# Patient Record
Sex: Male | Born: 1955 | Race: White | Hispanic: No | Marital: Single | Smoking: Current every day smoker
Health system: Southern US, Community
[De-identification: ages and names within clinical notes are randomized; demographics above are authoritative.]

## PROBLEM LIST (undated history)

## (undated) DIAGNOSIS — F329 Major depressive disorder, single episode, unspecified: Secondary | ICD-10-CM

## (undated) DIAGNOSIS — F419 Anxiety disorder, unspecified: Secondary | ICD-10-CM

## (undated) DIAGNOSIS — R3915 Urgency of urination: Secondary | ICD-10-CM

## (undated) DIAGNOSIS — J439 Emphysema, unspecified: Secondary | ICD-10-CM

## (undated) DIAGNOSIS — Z86010 Personal history of colonic polyps: Secondary | ICD-10-CM

## (undated) DIAGNOSIS — M549 Dorsalgia, unspecified: Secondary | ICD-10-CM

## (undated) DIAGNOSIS — E785 Hyperlipidemia, unspecified: Secondary | ICD-10-CM

## (undated) DIAGNOSIS — J449 Chronic obstructive pulmonary disease, unspecified: Secondary | ICD-10-CM

## (undated) DIAGNOSIS — Z8709 Personal history of other diseases of the respiratory system: Secondary | ICD-10-CM

## (undated) DIAGNOSIS — Z8489 Family history of other specified conditions: Secondary | ICD-10-CM

## (undated) DIAGNOSIS — K219 Gastro-esophageal reflux disease without esophagitis: Secondary | ICD-10-CM

## (undated) DIAGNOSIS — H269 Unspecified cataract: Secondary | ICD-10-CM

## (undated) DIAGNOSIS — F32A Depression, unspecified: Secondary | ICD-10-CM

## (undated) DIAGNOSIS — K5790 Diverticulosis of intestine, part unspecified, without perforation or abscess without bleeding: Secondary | ICD-10-CM

## (undated) DIAGNOSIS — R200 Anesthesia of skin: Secondary | ICD-10-CM

## (undated) DIAGNOSIS — G4700 Insomnia, unspecified: Secondary | ICD-10-CM

## (undated) DIAGNOSIS — C4491 Basal cell carcinoma of skin, unspecified: Secondary | ICD-10-CM

## (undated) DIAGNOSIS — K4090 Unilateral inguinal hernia, without obstruction or gangrene, not specified as recurrent: Secondary | ICD-10-CM

## (undated) DIAGNOSIS — G8929 Other chronic pain: Secondary | ICD-10-CM

## (undated) HISTORY — DX: Basal cell carcinoma of skin, unspecified: C44.91

## (undated) HISTORY — PX: OTHER SURGICAL HISTORY: SHX169

## (undated) HISTORY — DX: Gastro-esophageal reflux disease without esophagitis: K21.9

## (undated) HISTORY — DX: Chronic obstructive pulmonary disease, unspecified: J44.9

## (undated) HISTORY — DX: Dorsalgia, unspecified: M54.9

## (undated) HISTORY — PX: FINGER SURGERY: SHX640

## (undated) HISTORY — PX: COLONOSCOPY: SHX174

## (undated) HISTORY — PX: EYE SURGERY: SHX253

## (undated) HISTORY — DX: Other chronic pain: G89.29

## (undated) HISTORY — DX: Hyperlipidemia, unspecified: E78.5

---

## 1997-11-12 ENCOUNTER — Ambulatory Visit (HOSPITAL_COMMUNITY): Admission: RE | Admit: 1997-11-12 | Discharge: 1997-11-12 | Payer: Self-pay | Admitting: Neurosurgery

## 1997-11-12 ENCOUNTER — Encounter: Payer: Self-pay | Admitting: Neurosurgery

## 1998-07-15 ENCOUNTER — Encounter: Payer: Self-pay | Admitting: Neurosurgery

## 1998-07-15 ENCOUNTER — Ambulatory Visit (HOSPITAL_COMMUNITY): Admission: RE | Admit: 1998-07-15 | Discharge: 1998-07-15 | Payer: Self-pay | Admitting: Neurosurgery

## 1998-10-30 ENCOUNTER — Emergency Department (HOSPITAL_COMMUNITY): Admission: EM | Admit: 1998-10-30 | Discharge: 1998-10-30 | Payer: Self-pay | Admitting: Emergency Medicine

## 1998-10-30 ENCOUNTER — Encounter: Payer: Self-pay | Admitting: Emergency Medicine

## 1999-01-14 ENCOUNTER — Encounter: Payer: Self-pay | Admitting: Neurosurgery

## 1999-01-14 ENCOUNTER — Ambulatory Visit (HOSPITAL_COMMUNITY): Admission: RE | Admit: 1999-01-14 | Discharge: 1999-01-14 | Payer: Self-pay | Admitting: Neurosurgery

## 2001-11-02 ENCOUNTER — Encounter: Payer: Self-pay | Admitting: Neurosurgery

## 2001-11-02 ENCOUNTER — Ambulatory Visit (HOSPITAL_COMMUNITY): Admission: RE | Admit: 2001-11-02 | Discharge: 2001-11-02 | Payer: Self-pay | Admitting: Neurosurgery

## 2002-11-23 ENCOUNTER — Encounter: Admission: RE | Admit: 2002-11-23 | Discharge: 2002-11-23 | Payer: Self-pay | Admitting: Neurosurgery

## 2002-12-13 ENCOUNTER — Encounter: Payer: Self-pay | Admitting: Neurosurgery

## 2002-12-13 ENCOUNTER — Encounter: Admission: RE | Admit: 2002-12-13 | Discharge: 2002-12-13 | Payer: Self-pay | Admitting: Neurosurgery

## 2004-06-16 ENCOUNTER — Ambulatory Visit (HOSPITAL_COMMUNITY): Admission: RE | Admit: 2004-06-16 | Discharge: 2004-06-16 | Payer: Self-pay | Admitting: Neurosurgery

## 2007-08-04 ENCOUNTER — Encounter: Admission: RE | Admit: 2007-08-04 | Discharge: 2007-08-04 | Payer: Self-pay | Admitting: Internal Medicine

## 2010-03-15 ENCOUNTER — Encounter: Payer: Self-pay | Admitting: Neurosurgery

## 2010-04-13 ENCOUNTER — Ambulatory Visit (INDEPENDENT_AMBULATORY_CARE_PROVIDER_SITE_OTHER): Payer: PRIVATE HEALTH INSURANCE | Admitting: Emergency Medicine

## 2010-04-13 ENCOUNTER — Other Ambulatory Visit: Payer: Self-pay | Admitting: Emergency Medicine

## 2010-04-13 ENCOUNTER — Ambulatory Visit
Admission: RE | Admit: 2010-04-13 | Discharge: 2010-04-13 | Disposition: A | Discharge status: Home / Self Care | Payer: 59 | Source: Ambulatory Visit | Attending: Emergency Medicine | Admitting: Emergency Medicine

## 2010-04-13 ENCOUNTER — Encounter: Payer: Self-pay | Admitting: Emergency Medicine

## 2010-04-13 DIAGNOSIS — R52 Pain, unspecified: Secondary | ICD-10-CM

## 2010-04-13 DIAGNOSIS — M79609 Pain in unspecified limb: Secondary | ICD-10-CM

## 2010-04-13 DIAGNOSIS — E785 Hyperlipidemia, unspecified: Secondary | ICD-10-CM | POA: Insufficient documentation

## 2010-04-21 NOTE — Assessment & Plan Note (Signed)
Summary: RIGHT RING FINGER DISLOCATED Procedure Room   Vital Signs:  Patient Profile:   55 Years Old Male CC:      Rt ring finger pain x 2 weeks injured while playing with his dog Height:     74 inches Weight:      172 pounds O2 Sat:      96 % O2 treatment:    Room Air Temp:     97.6 degrees F oral Pulse rate:   14 / minute Pulse rhythm:   regular Resp:     14 per minute BP sitting:   142 / 84  (left arm) Cuff size:   regular  Vitals Entered By: Emilio Math (April 13, 2010 1:05 PM)                History of Present Illness History from: patient Chief Complaint: Rt ring finger pain x 2 weeks injured while playing with his dog History of Present Illness: Playing with his dog 2 weeks ago and injured his R 4th finger.  It initially hurt a lot but the pain has subsided.  Now it feels loose to him.  No swelling or bruising.  It is affected his ADL's at home.  No other injuries noted.  No previous injuries.  REVIEW OF SYSTEMS Constitutional Symptoms      Denies fever, chills, night sweats, weight loss, weight gain, and fatigue.  Eyes       Denies change in vision, eye pain, eye discharge, glasses, contact lenses, and eye surgery. Ear/Nose/Throat/Mouth       Denies hearing loss/aids, change in hearing, ear pain, ear discharge, dizziness, frequent runny nose, frequent nose bleeds, sinus problems, sore throat, hoarseness, and tooth pain or bleeding.  Respiratory       Denies dry cough, productive cough, wheezing, shortness of breath, asthma, bronchitis, and emphysema/COPD.  Cardiovascular       Denies murmurs, chest pain, and tires easily with exhertion.    Gastrointestinal       Denies stomach pain, nausea/vomiting, diarrhea, constipation, blood in bowel movements, and indigestion. Genitourniary       Denies painful urination, kidney stones, and loss of urinary control. Neurological       Denies paralysis, seizures, and fainting/blackouts. Musculoskeletal       Complains  of muscle pain and joint pain.      Denies joint stiffness, decreased range of motion, redness, swelling, muscle weakness, and gout.      Comments: Pain with movement Skin       Denies bruising, unusual mles/lumps or sores, and hair/skin or nail changes.  Psych       Denies mood changes, temper/anger issues, anxiety/stress, speech problems, depression, and sleep problems.  Past History:  Past Medical History: Hyperlipidemia  Past Surgical History: Denies surgical history  Family History: Mother, CABG Father, D, Diabetes  Social History: 1 ppw, 40 years ETOH-yes No DRugs Unemployed Physical Exam General appearance: well developed, well nourished, no acute distress MSE: oriented to time, place, and person R 4th finger: normal DIP and PIP function.  +TTP at MCP and a large amount of ulnar deviation is possible at the MCP (compared to opposite side).  Cannot feel any specific deformity.  Fingers point to scaphoid when making fist.  Knuckle slightly smaller than opposite side. Assessment New Problems: FINGER PAIN (ICD-729.5) HYPERLIPIDEMIA (ICD-272.4)   Plan New Orders: New Patient Level III [99203] T-DG Hand Complete*R* [73130] Planning Comments:   Xray is read by radiology as normal.  I believe this is a ligament damage and I would like him to follow up with Dr. Melvyn Novas for this in case he needs surgical intervention for ligamentous injury.  Will defer to ortho for definitive treatment/splinting/etc.  Tylenol/Ibu as needed for pain.    The patient and/or caregiver has been counseled thoroughly with regard to medications prescribed including dosage, schedule, interactions, rationale for use, and possible side effects and they verbalize understanding.  Diagnoses and expected course of recovery discussed and will return if not improved as expected or if the condition worsens. Patient and/or caregiver verbalized understanding.   Orders Added: 1)  New Patient Level III  [04540] 2)  T-DG Hand Complete*R* [73130]

## 2010-04-24 ENCOUNTER — Other Ambulatory Visit: Payer: Self-pay | Admitting: Orthopedic Surgery

## 2010-04-24 ENCOUNTER — Ambulatory Visit
Admission: RE | Admit: 2010-04-24 | Discharge: 2010-04-24 | Disposition: A | Discharge status: Home / Self Care | Payer: Medicare Other | Source: Ambulatory Visit | Attending: Orthopedic Surgery | Admitting: Orthopedic Surgery

## 2010-04-24 DIAGNOSIS — M79644 Pain in right finger(s): Secondary | ICD-10-CM

## 2010-05-25 ENCOUNTER — Ambulatory Visit: Payer: PRIVATE HEALTH INSURANCE | Attending: Orthopedic Surgery | Admitting: Physical Therapy

## 2010-05-25 DIAGNOSIS — M2560 Stiffness of unspecified joint, not elsewhere classified: Secondary | ICD-10-CM | POA: Insufficient documentation

## 2010-05-25 DIAGNOSIS — M6281 Muscle weakness (generalized): Secondary | ICD-10-CM | POA: Insufficient documentation

## 2010-05-25 DIAGNOSIS — IMO0001 Reserved for inherently not codable concepts without codable children: Secondary | ICD-10-CM | POA: Insufficient documentation

## 2010-06-05 ENCOUNTER — Encounter: Payer: 59 | Admitting: Physical Therapy

## 2010-06-08 ENCOUNTER — Ambulatory Visit: Payer: PRIVATE HEALTH INSURANCE | Admitting: Physical Therapy

## 2010-06-15 ENCOUNTER — Ambulatory Visit: Payer: PRIVATE HEALTH INSURANCE | Admitting: Physical Therapy

## 2010-06-15 ENCOUNTER — Encounter: Payer: 59 | Admitting: Physical Therapy

## 2010-06-16 ENCOUNTER — Encounter: Payer: PRIVATE HEALTH INSURANCE | Admitting: Physical Therapy

## 2010-06-22 ENCOUNTER — Encounter: Payer: PRIVATE HEALTH INSURANCE | Admitting: Physical Therapy

## 2010-06-25 ENCOUNTER — Encounter: Payer: PRIVATE HEALTH INSURANCE | Admitting: Physical Therapy

## 2010-08-25 ENCOUNTER — Ambulatory Visit
Admission: RE | Admit: 2010-08-25 | Discharge: 2010-08-25 | Disposition: A | Discharge status: Home / Self Care | Payer: PRIVATE HEALTH INSURANCE | Source: Ambulatory Visit | Attending: Internal Medicine | Admitting: Internal Medicine

## 2010-08-25 ENCOUNTER — Other Ambulatory Visit: Payer: Self-pay | Admitting: Internal Medicine

## 2010-08-25 DIAGNOSIS — R05 Cough: Secondary | ICD-10-CM

## 2010-08-25 DIAGNOSIS — R059 Cough, unspecified: Secondary | ICD-10-CM

## 2010-09-11 ENCOUNTER — Other Ambulatory Visit: Payer: Self-pay | Admitting: Orthopedic Surgery

## 2010-09-11 DIAGNOSIS — S63639A Sprain of interphalangeal joint of unspecified finger, initial encounter: Secondary | ICD-10-CM

## 2010-09-11 DIAGNOSIS — S6390XA Sprain of unspecified part of unspecified wrist and hand, initial encounter: Secondary | ICD-10-CM

## 2010-09-17 ENCOUNTER — Other Ambulatory Visit: Payer: PRIVATE HEALTH INSURANCE

## 2010-09-18 ENCOUNTER — Ambulatory Visit
Admission: RE | Admit: 2010-09-18 | Discharge: 2010-09-18 | Disposition: A | Discharge status: Home / Self Care | Payer: PRIVATE HEALTH INSURANCE | Source: Ambulatory Visit | Attending: Orthopedic Surgery | Admitting: Orthopedic Surgery

## 2010-09-18 DIAGNOSIS — S63639A Sprain of interphalangeal joint of unspecified finger, initial encounter: Secondary | ICD-10-CM

## 2010-09-18 DIAGNOSIS — S6390XA Sprain of unspecified part of unspecified wrist and hand, initial encounter: Secondary | ICD-10-CM

## 2010-09-18 MED ORDER — IOHEXOL 180 MG/ML  SOLN
1.0000 mL | Freq: Once | INTRAMUSCULAR | Status: AC | PRN
  Administered 2010-09-18: 1 mL via INTRA_ARTICULAR

## 2010-12-23 ENCOUNTER — Encounter: Payer: Self-pay | Admitting: Pulmonary Disease

## 2010-12-24 ENCOUNTER — Institutional Professional Consult (permissible substitution): Payer: PRIVATE HEALTH INSURANCE | Admitting: Pulmonary Disease

## 2011-01-22 ENCOUNTER — Ambulatory Visit (INDEPENDENT_AMBULATORY_CARE_PROVIDER_SITE_OTHER)
Admission: RE | Admit: 2011-01-22 | Discharge: 2011-01-22 | Disposition: A | Discharge status: Home / Self Care | Payer: Medicare Other | Source: Ambulatory Visit | Attending: Pulmonary Disease | Admitting: Pulmonary Disease

## 2011-01-22 ENCOUNTER — Ambulatory Visit (INDEPENDENT_AMBULATORY_CARE_PROVIDER_SITE_OTHER): Payer: Medicare Other | Admitting: Pulmonary Disease

## 2011-01-22 ENCOUNTER — Encounter: Payer: Self-pay | Admitting: Pulmonary Disease

## 2011-01-22 VITALS — BP 112/78 | HR 90 | Temp 98.1°F | Ht 74.0 in | Wt 180.8 lb

## 2011-01-22 DIAGNOSIS — J449 Chronic obstructive pulmonary disease, unspecified: Secondary | ICD-10-CM

## 2011-01-22 NOTE — Assessment & Plan Note (Signed)
He has extensive prior history of smoking.  He has since quit.  He has very mild obstruction on spirometry today.  He is not sure if scheduled inhaler therapy is helping him.  I will repeat his chest xray today and get copy of his records from [facility].  Advised him he can try stopping spiriva and monitor his symptoms.  He is to call if his breathing gets worse.  He can continue proair as needed.  He had his pneumonia shot 2 years ago.  He did get his flu shot this season.

## 2011-01-22 NOTE — Progress Notes (Signed)
Chief Complaint  Patient presents with  . Advice Only    Pt was dx w/ COPD 08/25/10. Pt here for second opinion.     History of Present Illness:  Austin Frazier is a 55 y.o. male former smoker for evaluation of COPD.  He has an extensive prior history of smoking.  He went on a trip with his partner to [place].  He developed a bronchitis then.  As a result he went to see his PCP.  He had a chest xray in July which showed hyperinflation suggestive of COPD.  He was then sent to pulmonary at [facility].  He had spirometry there, and told he has COPD.  He was started on spiriva.  He was referred to [facility] for a second opinion.  He quit smoking in July.  He used to get wheezing, but not recently.  He had noticed getting winded with activity for the past two years.  Since he stopped smoking his breathing has improved.  He is now able to exercise on a daily basis for about 40 minutes, which includes cardiovascular and weight training.  He still gets cough with clear to yellow sputum, but not as much as before.  He denies hemoptysis.  There is no history of asthma, pneumonia, or tuberculosis.  He does have a history of allergies, and was told he is allergic to ragweed.  He has trouble with his sinuses and epistaxis, and has been seen by Dr. Annalee Genta with ENT.  He was told he may need sinus surgery.  He was using afrin.  He has since been switched to nasal irrigation and flonase.  With this switch he no longer has nose bleeds, but still has sinus congestion.  He thinks his nose bleeds started when he started spiriva.  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  He denies other occupational exposures.  He has a Development worker, international aid, but no other animal exposures.  He is from [place].  He currently lives with his partner.  Past Medical History  Diagnosis Date  . Back pain, chronic   . GERD (gastroesophageal reflux disease)   . Hyperlipemia   . COPD, mild   . Basal cell carcinoma   .  Chronic sinusitis     No past surgical history on file.  Current Outpatient Prescriptions on File Prior to Visit  Medication Sig Dispense Refill  . albuterol (PROVENTIL HFA;VENTOLIN HFA) 108 (90 BASE) MCG/ACT inhaler Inhale 2 puffs into the lungs every 6 (six) hours as needed.        . ALPRAZolam (XANAX) 0.5 MG tablet Take 0.5 mg by mouth 3 (three) times daily as needed.        . butalbital-acetaminophen-caffeine (FIORICET WITH CODEINE) 50-325-40-30 MG per capsule Take 1 capsule by mouth every 4 (four) hours as needed.        . fluticasone (FLONASE) 50 MCG/ACT nasal spray Place 2 sprays into the nose daily.        Marland Kitchen HYDROcodone-acetaminophen (NORCO) 10-325 MG per tablet Take 1 tablet by mouth every 6 (six) hours as needed.          No Known Allergies  family history includes Diabetes in his brother and father; Heart attack in his mother; Hyperlipidemia in his mother; Hypertension in his mother; and Stroke in his mother.   reports that he quit smoking about 4 months ago. He does not have any smokeless tobacco history on file. He reports that he drinks alcohol. He  reports that he does not use illicit drugs.  Review of Systems - 12 point negative except above  Blood pressure 112/78, pulse 90, temperature 98.1 F (36.7 C), temperature source Oral, height 6\' 2"  (1.88 m), weight 180 lb 12.8 oz (82.01 kg), SpO2 98.00%. Body mass index is 23.21 kg/(m^2).  Physical Exam:  General - Thin, healthy HEENT - Wears glasses, PERRLA, EOMI, no sinus tenderness, clear nasal discharge, no oral exudate, no LAN, no thyromegaly Cardiac - s1s2 regular, no murmur Chest - good air entry, no wheeze/rales/dullness Abdomen - soft, non-tender, no organomegaly Extremities - no e/c/c Neurologic - normal strength, CN intact Skin - no rashes Psychiatric - normal mood, behavior  *RADIOLOGY REPORT* 08/25/10: Clinical Data: Cough and congestion, smoker, lethargy  CHEST - 2 VIEW  Comparison: None.  Findings: The  cardiac silhouette, mediastinum, pulmonary vasculature are within normal limits. Both lungs are clear. There is hyperexpansion, consistent with an element of COPD. There is no acute bony abnormality.  IMPRESSION:  Hyperexpansion, consistent with COPD.  There is no evidence of acute cardiac or pulmonary process.  Original Report Authenticated By: Brandon Melnick, M.D.  Spirometry 01/22/11>>FEV1 3.72(88%), FEV1% 69.  Borderline obstruction.  Assessment/Plan:  COPD (chronic obstructive pulmonary disease) He has extensive prior history of smoking.  He has since quit.  He has very mild obstruction on spirometry today.  He is not sure if scheduled inhaler therapy is helping him.  I will repeat his chest xray today and get copy of his records from [facility].  Advised him he can try stopping spiriva and monitor his symptoms.  He is to call if his breathing gets worse.  He can continue proair as needed.  He had his pneumonia shot 2 years ago.  He did get his flu shot this season.     Outpatient Encounter Prescriptions as of 01/22/2011  Medication Sig Dispense Refill  . albuterol (PROVENTIL HFA;VENTOLIN HFA) 108 (90 BASE) MCG/ACT inhaler Inhale 2 puffs into the lungs every 6 (six) hours as needed.        . ALPRAZolam (XANAX) 0.5 MG tablet Take 0.5 mg by mouth 3 (three) times daily as needed.        Marland Kitchen ascorbic acid (VITAMIN C) 1000 MG tablet Take 1,000 mg by mouth daily.        Marland Kitchen aspirin 81 MG tablet Take 81 mg by mouth daily.        . butalbital-acetaminophen-caffeine (FIORICET WITH CODEINE) 50-325-40-30 MG per capsule Take 1 capsule by mouth every 4 (four) hours as needed.        . Eszopiclone (ESZOPICLONE) 3 MG TABS Take 3 mg by mouth at bedtime. Take immediately before bedtime       . fluticasone (FLONASE) 50 MCG/ACT nasal spray Place 2 sprays into the nose daily.        Marland Kitchen HYDROcodone-acetaminophen (NORCO) 10-325 MG per tablet Take 1 tablet by mouth every 6 (six) hours as needed.        Marland Kitchen  DISCONTD: ezetimibe-simvastatin (VYTORIN) 10-20 MG per tablet Take 1 tablet by mouth at bedtime.        Marland Kitchen DISCONTD: tiotropium (SPIRIVA) 18 MCG inhalation capsule Place 18 mcg into inhaler and inhale daily.        Marland Kitchen DISCONTD: esomeprazole (NEXIUM) 40 MG capsule Take 40 mg by mouth daily before breakfast.        . DISCONTD: Eszopiclone (ESZOPICLONE) 3 MG TABS Take 3 mg by mouth at bedtime. Take immediately before bedtime       .  DISCONTD: finasteride (PROPECIA) 1 MG tablet Take 1 mg by mouth daily.        Marland Kitchen DISCONTD: Fluticasone-Salmeterol (ADVAIR) 250-50 MCG/DOSE AEPB Inhale 1 puff into the lungs every 12 (twelve) hours.        Marland Kitchen DISCONTD: Liniments (BIOFREEZE/ILEX EX) Apply topically. Use as directed       . DISCONTD: meloxicam (MOBIC) 15 MG tablet Take 15 mg by mouth daily.          Thaily Hackworth Pager:  [phone] 01/22/2011, 12:57 PM

## 2011-01-22 NOTE — Patient Instructions (Signed)
Stop spiriva>>call if symptoms get worse Proair two puffs four times per day as needed for cough, wheeze, chest congestion, or shortness of breath Chest xray today>>will call with results Follow up in 6 months

## 2011-01-26 ENCOUNTER — Telehealth: Payer: Self-pay | Admitting: Pulmonary Disease

## 2011-01-26 MED ORDER — TIOTROPIUM BROMIDE MONOHYDRATE 18 MCG IN CAPS: 18.0000 ug | ORAL_CAPSULE | Freq: Every day | RESPIRATORY_TRACT | Status: DC

## 2011-01-26 NOTE — Telephone Encounter (Signed)
Dg Chest 2 View  01/22/2011  *RADIOLOGY REPORT*  Clinical Data: Former smoker, COPD, cough  CHEST - 2 VIEW  Comparison: 08/25/2010  Findings: Normal heart size, mediastinal contours, and pulmonary vascularity. Atherosclerotic calcification aortic arch. Emphysematous changes without infiltrate or effusion. No pneumothorax. Bones unremarkable.  IMPRESSION: Emphysematous changes consistent with COPD. No acute abnormalities.  Original Report Authenticated By: Lollie Marrow, M.D.   Discussed results with pt.  He noticed his breathing getting worse w/o spiriva.  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  Advised that he will need to continue with Spiriva.

## 2011-02-19 ENCOUNTER — Telehealth: Payer: Self-pay | Admitting: Pulmonary Disease

## 2011-02-19 NOTE — Telephone Encounter (Signed)
Received copies from Dr. Lorre Nick 02/19/11. Forwarded 6pages to Dr. Rosezella Rumpf review.

## 2011-07-02 ENCOUNTER — Ambulatory Visit (INDEPENDENT_AMBULATORY_CARE_PROVIDER_SITE_OTHER): Payer: Medicare Other | Admitting: Pulmonary Disease

## 2011-07-02 ENCOUNTER — Encounter: Payer: Self-pay | Admitting: Pulmonary Disease

## 2011-07-02 VITALS — BP 124/80 | HR 84 | Temp 98.0°F | Ht 74.0 in | Wt 180.4 lb

## 2011-07-02 DIAGNOSIS — J449 Chronic obstructive pulmonary disease, unspecified: Secondary | ICD-10-CM

## 2011-07-02 NOTE — Progress Notes (Signed)
Chief Complaint  Patient presents with  . Follow-up    Pt states his breathing has been fine. Still has occasional coughing w. phlem. denies any wheezing, chest tx. Pt states when he uses his proair does not feel like it helps--? alternative. Pt is requesting to have a breathing test done    History of Present Illness: Austin Frazier is a 56 y.o. male former smoker with COPD.  His breathing has been doing well.  He does not use his albuterol much.  He is not having much cough.    Past Medical History  Diagnosis Date  . Back pain, chronic   . GERD (gastroesophageal reflux disease)   . Hyperlipemia   . COPD, mild   . Basal cell carcinoma   . Chronic sinusitis     No past surgical history on file.  No Known Allergies  Physical Exam:  Blood pressure 124/80, pulse 84, temperature 98 F (36.7 C), temperature source Oral, height 6\' 2"  (1.88 m), weight 180 lb 6.4 oz (81.829 kg), SpO2 96.00%. Body mass index is 23.16 kg/(m^2). Wt Readings from Last 2 Encounters:  07/02/11 180 lb 6.4 oz (81.829 kg)  01/22/11 180 lb 12.8 oz (82.01 kg)    General - Thin, healthy  HEENT - Wears glasses, PERRLA, EOMI, no sinus tenderness, clear nasal discharge, no oral exudate, no LAN, no thyromegaly  Cardiac - s1s2 regular, no murmur  Chest - good air entry, no wheeze/rales/dullness  Abdomen - soft, non-tender, no organomegaly  Extremities - no e/c/c  Neurologic - normal strength, CN intact  Skin - no rashes  Psychiatric - normal mood, behavior   Assessment/Plan:  Outpatient Encounter Prescriptions as of 07/02/2011  Medication Sig Dispense Refill  . albuterol (PROVENTIL HFA;VENTOLIN HFA) 108 (90 BASE) MCG/ACT inhaler Inhale 2 puffs into the lungs every 6 (six) hours as needed.        . ALPRAZolam (XANAX) 0.5 MG tablet Take 0.5 mg by mouth 3 (three) times daily as needed.        Marland Kitchen ascorbic acid (VITAMIN C) 1000 MG tablet Take 1,000 mg by mouth daily.        Marland Kitchen aspirin 81 MG tablet Take 81 mg by  mouth daily.        . butalbital-acetaminophen-caffeine (FIORICET WITH CODEINE) 50-325-40-30 MG per capsule Take 1 capsule by mouth every 4 (four) hours as needed.        . Eszopiclone (ESZOPICLONE) 3 MG TABS Take 3 mg by mouth at bedtime. Take immediately before bedtime       . fexofenadine-pseudoephedrine (ALLEGRA-D) 60-120 MG per tablet Take 1 tablet by mouth daily.      . fluticasone (FLONASE) 50 MCG/ACT nasal spray Place 2 sprays into the nose daily.        Marland Kitchen HYDROcodone-acetaminophen (NORCO) 10-325 MG per tablet Take 1 tablet by mouth every 6 (six) hours as needed.        . tiotropium (SPIRIVA HANDIHALER) 18 MCG inhalation capsule Place 1 capsule (18 mcg total) into inhaler and inhale daily.  30 capsule  2    Austin Frazier Pager:  [phone] 07/02/2011, 1:52 PM

## 2011-07-02 NOTE — Patient Instructions (Signed)
Will schedule PFT and call with results Follow up in one year

## 2011-07-02 NOTE — Assessment & Plan Note (Signed)
Stable.  Will repeat his PFT and call him with results.  If numbers look okay, could try changing spiriva to qod.

## 2011-08-04 ENCOUNTER — Telehealth: Payer: Self-pay | Admitting: Pulmonary Disease

## 2011-08-04 MED ORDER — TIOTROPIUM BROMIDE MONOHYDRATE 18 MCG IN CAPS: 18.0000 ug | ORAL_CAPSULE | Freq: Every day | RESPIRATORY_TRACT | Status: DC

## 2011-08-04 NOTE — Telephone Encounter (Signed)
Refill resent pt is aware. Verified that pharmacy did receive the rx. Carron Curie, CMA

## 2011-08-04 NOTE — Telephone Encounter (Signed)
Refill sent. Pt is aware. Perl Folmar, CMA  

## 2011-08-04 NOTE — Telephone Encounter (Signed)
Patient states [facility] did not receive refill for spiriva.  He is asking for this to be CALLED in.  Thanks.

## 2011-08-05 ENCOUNTER — Telehealth: Payer: Self-pay | Admitting: Pulmonary Disease

## 2011-08-05 NOTE — Telephone Encounter (Signed)
I spoke with Amy from [facility] and she wanted to let us know that pt had called their corporate office to complain on them since they did not receive the e-prescribe rx for spiriva. She states pt was very demanding and wanted to know if we can document in pt chart to call his rx's in instead of e-prescribing them. She states sometimes when they are receiving a lot of rx's at one time it will kick back the system and delay some of the rx's coming in. Will forward to VS as an [place]

## 2011-08-06 NOTE — Telephone Encounter (Signed)
Noted  

## 2011-08-23 ENCOUNTER — Telehealth: Payer: Self-pay | Admitting: Pulmonary Disease

## 2011-08-23 NOTE — Telephone Encounter (Signed)
I spoke with Austin Frazier and he c/o occasional cough, throat feels congested x yesterday. Denies any wheezing, chest tx, sob, nasal congestion, fever, chills, sweats, nausea, vomiting. He has been taking claritin D daily and this has helped. He stated VS advised him to call when he began to feel like this before it gets down into his chest. Austin Frazier requesting further recsNo available openings. Please advise Dr. Craige Cotta, thanks  No Known Allergies

## 2011-08-23 NOTE — Telephone Encounter (Signed)
Pt called back Austin Frazier  

## 2011-08-23 NOTE — Telephone Encounter (Signed)
Left message on patient voicemail to call back tonight to answering service if he needs help overnight.  Otherwise will call him again tomorrow.

## 2011-08-24 NOTE — Telephone Encounter (Signed)
I spoke with pt and he states he is feeling much better today. His throat is feeling fine now. He believes he was just having an "allergy attack". He states he will call back if he begins to feel like this again. Will forward to VS so he is aware.

## 2011-08-24 NOTE — Telephone Encounter (Signed)
Pt returned VS's call & stated that he is feeling better.  Pt stated that he was unsure of what was going on, but is feeling better today.  Antionette Fairy

## 2011-08-24 NOTE — Telephone Encounter (Signed)
Noted  

## 2012-02-09 ENCOUNTER — Telehealth: Payer: Self-pay | Admitting: Pulmonary Disease

## 2012-02-09 NOTE — Telephone Encounter (Signed)
Noted  

## 2012-02-09 NOTE — Telephone Encounter (Signed)
I spoke with. C/o dry cough, nasal congestion that started this morning. No wheezing, chest tx. Pt stated he just wanted something called in and advised me "anytime he calls, VS calls him something in". I advised pt Dr. Craige Cotta is not in this afternoon and pt has not been seen since May. I offered to schedule pt an appt he then became upset with me. He advised me if I would pay attention and look in his chart and see that VS always calls him in something. He then stated he will call his PCP for recs. Will forward to VS so he is aware of this

## 2012-02-25 ENCOUNTER — Encounter (HOSPITAL_COMMUNITY): Payer: Self-pay | Admitting: Pharmacy Technician

## 2012-03-01 ENCOUNTER — Other Ambulatory Visit: Payer: Self-pay | Admitting: Otolaryngology

## 2012-03-02 ENCOUNTER — Other Ambulatory Visit (HOSPITAL_COMMUNITY): Payer: Medicare Other

## 2012-04-04 ENCOUNTER — Other Ambulatory Visit: Payer: Self-pay | Admitting: Otolaryngology

## 2012-04-14 IMAGING — CR DG HAND COMPLETE 3+V*R*
3 series · 3 of 3 positions shown · non-contrast
Comparison: None.

CLINICAL DATA: Right ring finger injury.

RIGHT HAND - COMPLETE 3+ VIEW

[view not recorded (1 of 3)]
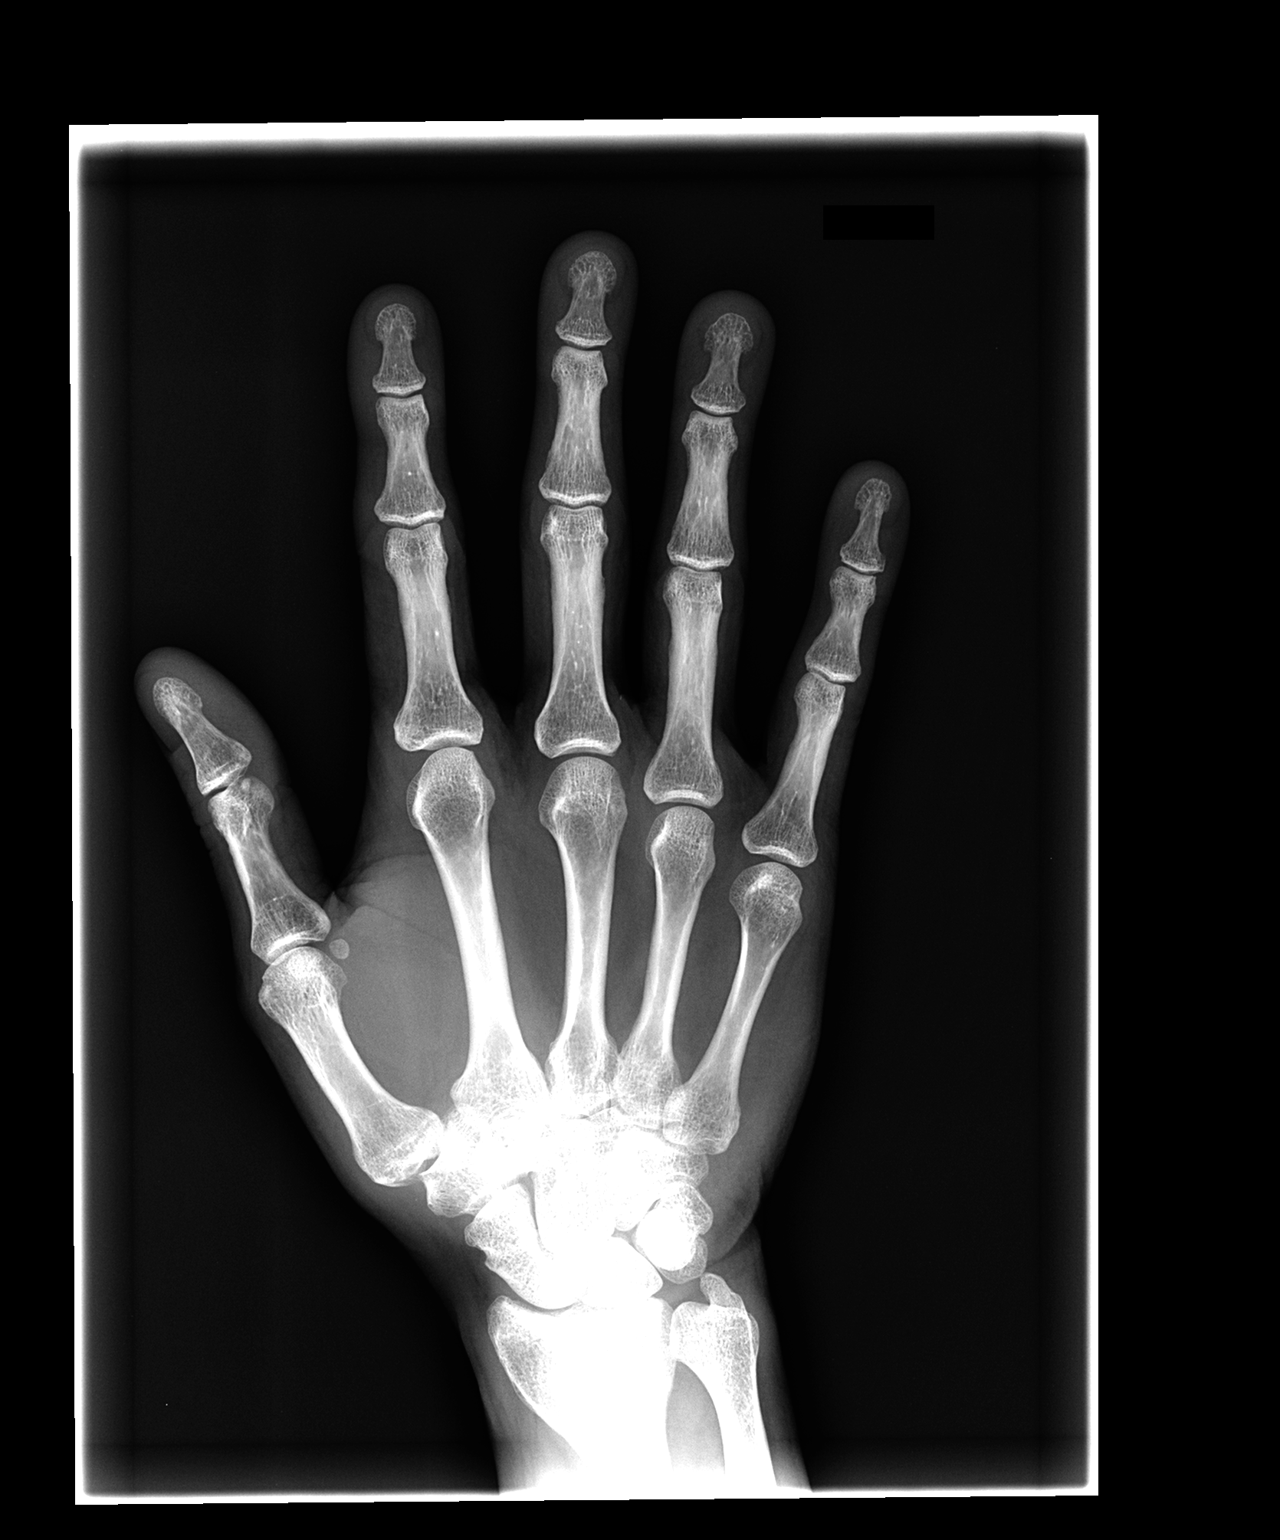

[view not recorded (2 of 3)]
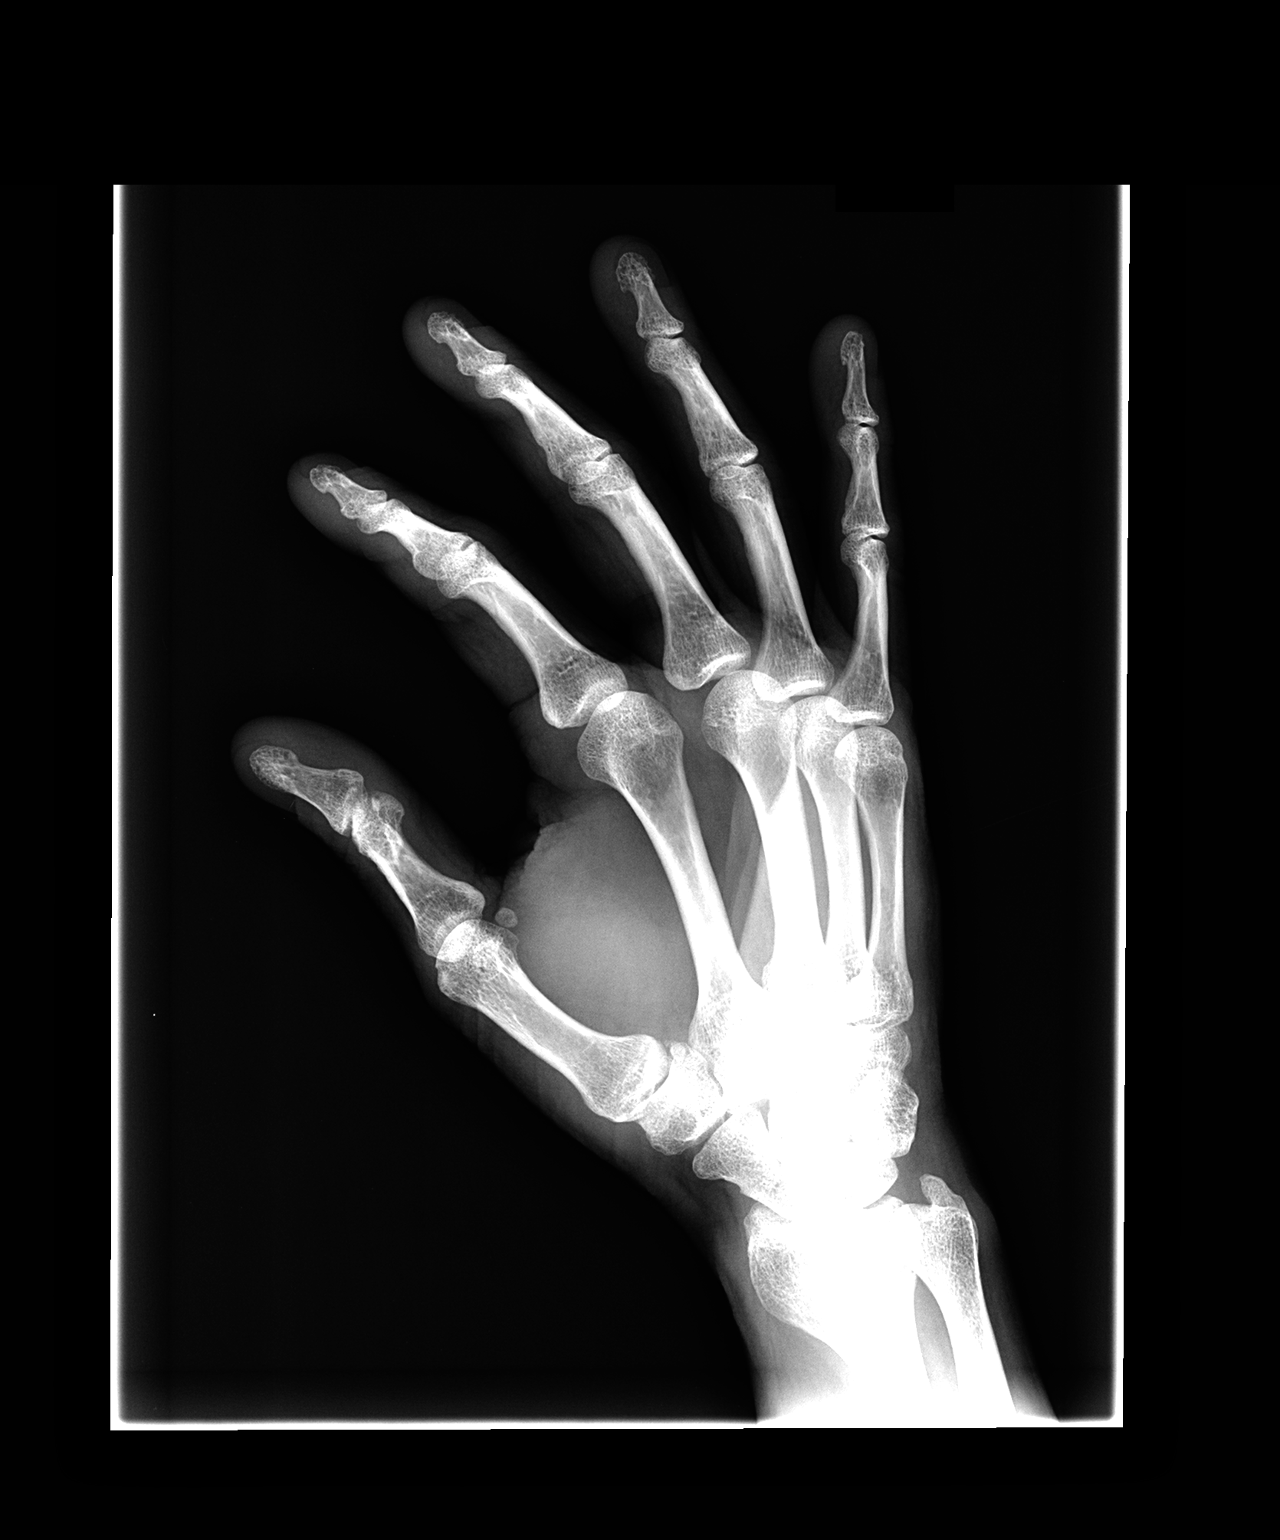

[view not recorded (3 of 3)]
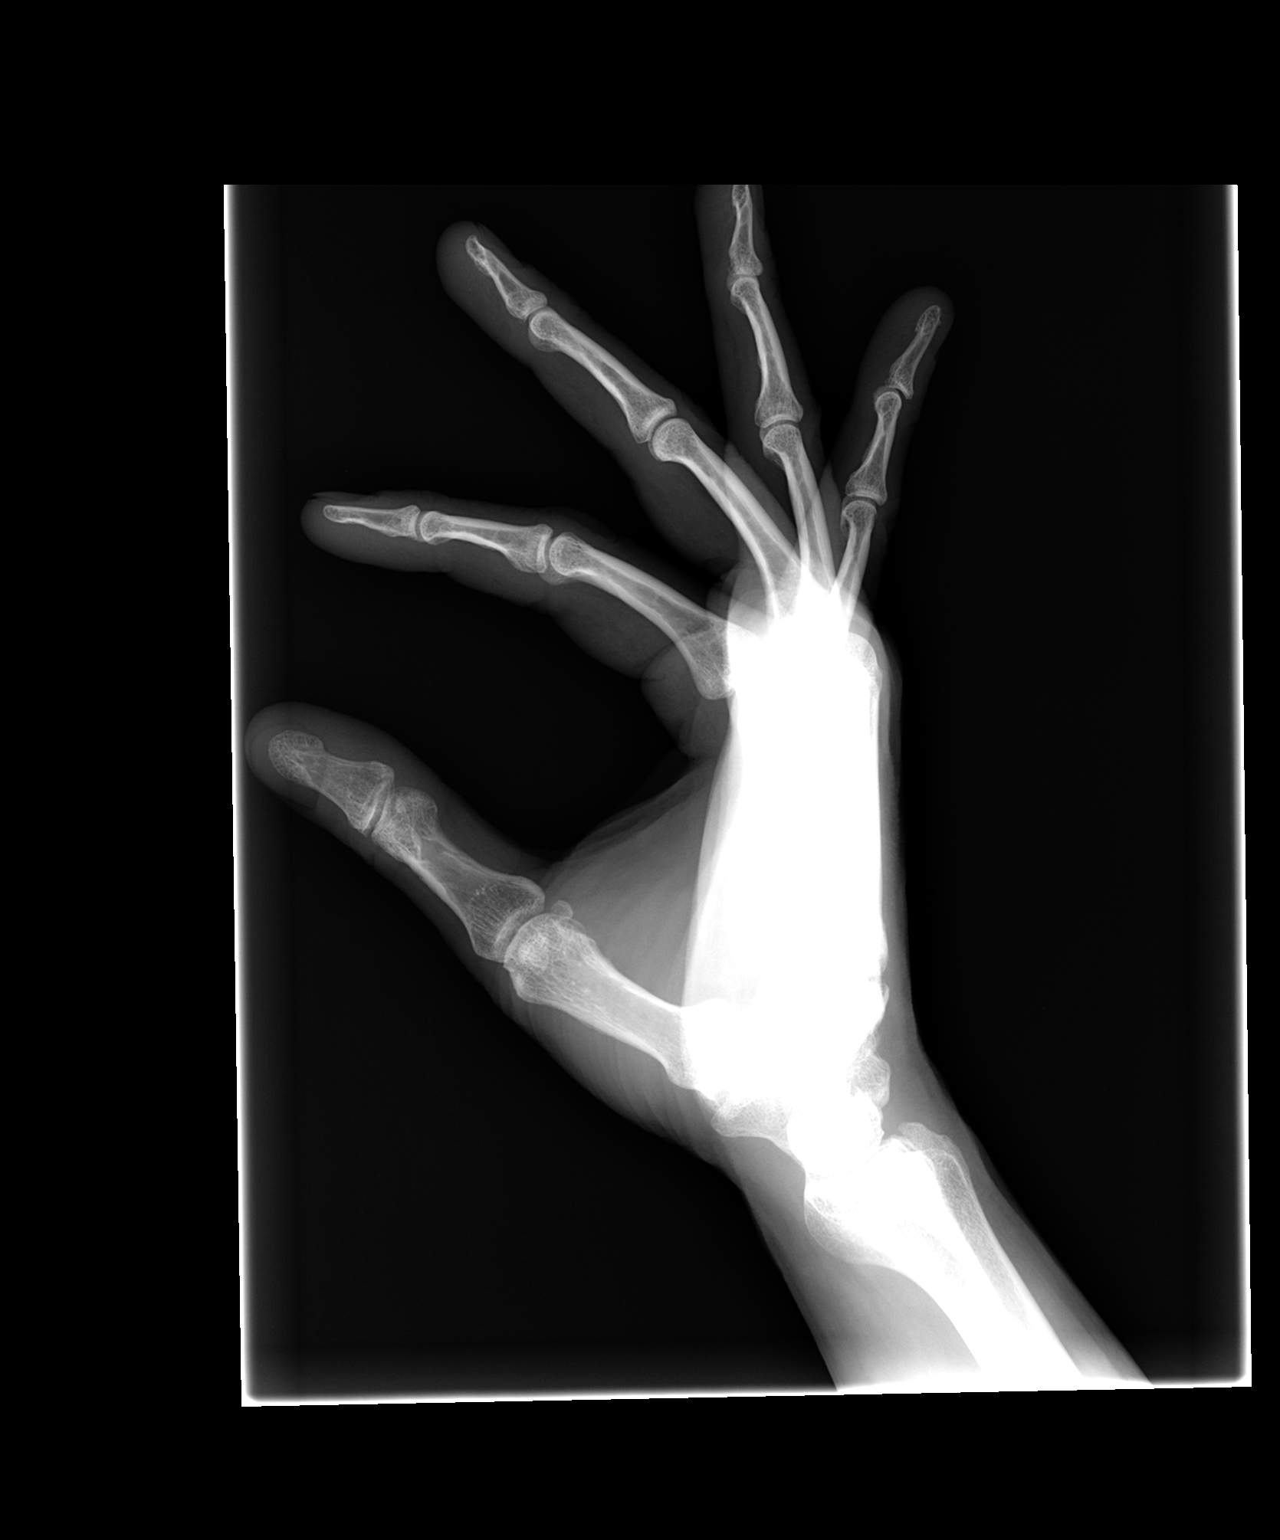

[3 of 3 positions shown; findings below may reference images not displayed]

FINDINGS: There is no fracture, dislocation, or other significant
abnormality.
IMPRESSION: Normal right hand.

## 2012-04-20 ENCOUNTER — Other Ambulatory Visit (HOSPITAL_COMMUNITY): Payer: Medicare Other

## 2012-04-24 ENCOUNTER — Encounter (HOSPITAL_COMMUNITY): Admission: RE | Admit: 2012-04-24 | Payer: Medicare Other | Source: Ambulatory Visit

## 2012-04-26 ENCOUNTER — Ambulatory Visit (HOSPITAL_COMMUNITY): Admission: RE | Admit: 2012-04-26 | Payer: Medicare Other | Source: Ambulatory Visit | Admitting: Otolaryngology

## 2012-04-26 ENCOUNTER — Encounter (HOSPITAL_COMMUNITY): Admission: RE | Payer: Self-pay | Source: Ambulatory Visit

## 2012-04-26 SURGERY — SEPTOPLASTY, NOSE, WITH NASAL TURBINATE REDUCTION
Anesthesia: General | Site: Nose | Laterality: Bilateral

## 2012-06-14 ENCOUNTER — Telehealth: Payer: Self-pay | Admitting: Pulmonary Disease

## 2012-06-14 NOTE — Telephone Encounter (Signed)
Called and spoke to pt on 06/14/12 to make next ov per recall.  Pt stated his mother had a stroke and he is busy with her right now.  He will call us back @ a later time when he can schedule appt. Leanora Ivanoff

## 2012-06-29 ENCOUNTER — Telehealth: Payer: Self-pay | Admitting: Pulmonary Disease

## 2012-06-29 NOTE — Telephone Encounter (Signed)
I spoke with Austin Frazier. He states he needs a letter from VS stating he has COPD. He states he got a DUI last night and the police would not give him his proair bc Austin Frazier states he could not breathe. Per Austin Frazier his attorney advised him to get a letter from [facility] to state he has COPD for when he goes to court being the reason he needs proair. Please advise Dr. Craige Cotta. Austin Frazier would like this mailed to him.  thanks

## 2012-06-30 ENCOUNTER — Encounter: Payer: Self-pay | Admitting: Pulmonary Disease

## 2012-06-30 NOTE — Telephone Encounter (Signed)
Pt aware letter has been placed in the mail to him. Nothing further was needed

## 2012-06-30 NOTE — Telephone Encounter (Signed)
Letter printed and signed.  

## 2012-07-05 ENCOUNTER — Telehealth: Payer: Self-pay | Admitting: Pulmonary Disease

## 2012-07-05 NOTE — Telephone Encounter (Signed)
Per pt he never received letter we placed in the mail to him and he asked that we resend this to him again. I advised him will do so. I confirmed mailing address again and it is correct. This has been placed in the mail. Nothing further was needed

## 2013-06-01 ENCOUNTER — Encounter (HOSPITAL_COMMUNITY): Payer: Self-pay | Admitting: Pharmacy Technician

## 2013-06-12 ENCOUNTER — Other Ambulatory Visit: Payer: Self-pay | Admitting: Otolaryngology

## 2013-06-13 ENCOUNTER — Encounter (HOSPITAL_COMMUNITY)
Admission: RE | Admit: 2013-06-13 | Discharge: 2013-06-13 | Disposition: A | Discharge status: Home / Self Care | Payer: Medicare Other | Source: Ambulatory Visit | Attending: Otolaryngology | Admitting: Otolaryngology

## 2013-06-13 ENCOUNTER — Encounter (HOSPITAL_COMMUNITY)
Admission: RE | Admit: 2013-06-13 | Discharge: 2013-06-13 | Disposition: A | Discharge status: Home / Self Care | Payer: Medicare Other | Source: Ambulatory Visit | Attending: Anesthesiology | Admitting: Anesthesiology

## 2013-06-13 ENCOUNTER — Encounter (HOSPITAL_COMMUNITY): Payer: Self-pay

## 2013-06-13 HISTORY — DX: Anesthesia of skin: R20.0

## 2013-06-13 HISTORY — DX: Personal history of colonic polyps: Z86.010

## 2013-06-13 HISTORY — DX: Family history of other specified conditions: Z84.89

## 2013-06-13 HISTORY — DX: Unspecified cataract: H26.9

## 2013-06-13 HISTORY — DX: Urgency of urination: R39.15

## 2013-06-13 HISTORY — DX: Personal history of other diseases of the respiratory system: Z87.09

## 2013-06-13 HISTORY — DX: Depression, unspecified: F32.A

## 2013-06-13 HISTORY — DX: Emphysema, unspecified: J43.9

## 2013-06-13 HISTORY — DX: Major depressive disorder, single episode, unspecified: F32.9

## 2013-06-13 HISTORY — DX: Anxiety disorder, unspecified: F41.9

## 2013-06-13 HISTORY — DX: Insomnia, unspecified: G47.00

## 2013-06-13 HISTORY — DX: Diverticulosis of intestine, part unspecified, without perforation or abscess without bleeding: K57.90

## 2013-06-13 LAB — CBC
HCT: 46.9 % (ref 39.0–52.0)
Hemoglobin: 16 g/dL (ref 13.0–17.0)
MCH: 32.1 pg (ref 26.0–34.0)
MCHC: 34.1 g/dL (ref 30.0–36.0)
MCV: 94 fL (ref 78.0–100.0)
Platelets: 231 10*3/uL (ref 150–400)
RBC: 4.99 MIL/uL (ref 4.22–5.81)
RDW: 12.2 % (ref 11.5–15.5)
WBC: 6.6 10*3/uL (ref 4.0–10.5)

## 2013-06-13 LAB — BASIC METABOLIC PANEL
BUN: 13 mg/dL (ref 6–23)
CALCIUM: 10 mg/dL (ref 8.4–10.5)
CO2: 24 mEq/L (ref 19–32)
CREATININE: 0.86 mg/dL (ref 0.50–1.35)
Chloride: 101 mEq/L (ref 96–112)
GFR calc non Af Amer: >90 mL/min (ref >90)
Glucose, Bld: 102 mg/dL — ABNORMAL HIGH (ref 70–99)
Potassium: 4.6 mEq/L (ref 3.7–5.3)
Sodium: 140 mEq/L (ref 137–147)

## 2013-06-13 NOTE — Pre-Procedure Instructions (Signed)
Austin Frazier  06/13/2013   Your procedure is scheduled on:  Thurs, April 23 @ 7:30 AM  Report to [facility]  at 5:30 AM.  Call this number if you have problems the morning of surgery: [phone]   Remember:   Do not eat food or drink liquids after midnight.   Take these medicines the morning of surgery with A SIP OF WATER: Albuterol<Bring Your Inhaler With You>,Xanax(Alprazolam),Pain Pill(if needed),Nasonex(Mometasone),and Spiriva(Tiotropium)               No Goody's,BC's,Aleve,Aspirin,Ibuprofen,Fish Oil,or any Herbal Medications   Do not wear jewelry  Do not wear lotions, powders, or colognes. You may wear deodorant.  Men may shave face and neck.  Do not bring valuables to the hospital.  [facility] is not responsible                  for any belongings or valuables.               Contacts, dentures or bridgework may not be worn into surgery.  Leave suitcase in the car. After surgery it may be brought to your room.  For patients admitted to the hospital, discharge time is determined by your                treatment team.               Patients discharged the day of surgery will not be allowed to drive  home.    Special Instructions:  [name] - Preparing for Surgery  Before surgery, you can play an important role.  Because skin is not sterile, your skin needs to be as free of germs as possible.  You can reduce the number of germs on you skin by washing with CHG (chlorahexidine gluconate) soap before surgery.  CHG is an antiseptic cleaner which kills germs and bonds with the skin to continue killing germs even after washing.  Please DO NOT use if you have an allergy to CHG or antibacterial soaps.  If your skin becomes reddened/irritated stop using the CHG and inform your nurse when you arrive at Short Stay.  Do not shave (including legs and underarms) for at least 48 hours prior to the first CHG shower.  You may shave your face.  Please follow these instructions  carefully:   1.  Shower with CHG Soap the night before surgery and the                                morning of Surgery.  2.  If you choose to wash your hair, wash your hair first as usual with your       normal shampoo.  3.  After you shampoo, rinse your hair and body thoroughly to remove the                      Shampoo.  4.  Use CHG as you would any other liquid soap.  You can apply chg directly       to the skin and wash gently with scrungie or a clean washcloth.  5.  Apply the CHG Soap to your body ONLY FROM THE NECK DOWN.        Do not use on open wounds or open sores.  Avoid contact with your eyes,       ears, mouth and genitals (private parts).  Wash genitals (private parts)       with your normal soap.  6.  Wash thoroughly, paying special attention to the area where your surgery        will be performed.  7.  Thoroughly rinse your body with warm water from the neck down.  8.  DO NOT shower/wash with your normal soap after using and rinsing off       the CHG Soap.  9.  Pat yourself dry with a clean towel.            10.  Wear clean pajamas.            11.  Place clean sheets on your bed the night of your first shower and do not        sleep with pets.  Day of Surgery  Do not apply any lotions/deoderants the morning of surgery.  Please wear clean clothes to the hospital/surgery center.     Please read over the following fact sheets that you were given: Pain Booklet, Coughing and Deep Breathing and Surgical Site Infection Prevention

## 2013-06-13 NOTE — Progress Notes (Addendum)
Pt doesn't have a cardiologist  Denies ever having an echo/stress test/heart cath  Denies EKG or CXR in past yr   Medical Md is Dr.Kimberly Karlton Lemon  Was seeing Dr.Sood but hasn't seen him since 2013

## 2013-06-13 NOTE — Progress Notes (Signed)
Pt HR was 117 in PAT;when questioned he states his HR is always like that

## 2013-06-13 NOTE — Progress Notes (Signed)
06/13/13 0950  OBSTRUCTIVE SLEEP APNEA  Have you ever been diagnosed with sleep apnea through a sleep study? No  Do you snore loudly (loud enough to be heard through closed doors)?  1  Do you often feel tired, fatigued, or sleepy during the daytime? 0  Has anyone observed you stop breathing during your sleep? 1  Do you have, or are you being treated for high blood pressure? 0  BMI more than 35 kg/m2? 0  Age over 58 years old? 1  Neck circumference greater than 40 cm/16 inches? 0  Gender: 1  Obstructive Sleep Apnea Score 4  Score 4 or greater  Results sent to PCP

## 2013-06-13 NOTE — Progress Notes (Signed)
Anesthesia chart review: Patient is a 58 year old male posted for nasal septoplasty with turbinate reduction tomorrow by Dr. Wilburn Cornelia.   History includes smoking, chronic back pain, chronic sinusitis, rheumatoid arthritis, diverticulosis, depression, anxiety, hyperlipidemia, GERD, COPD, basal cell carcinoma with excision, cataracts. OSA screening score was 4. PCP is Dr. Willey Blade. He has seen pulmonologist Dr. Halford Chessman in the past but not since 2013.  Anesthesia history: He denied any known personal anesthesia complications but reported that his mom had surgery which she could feel want was going on during surgery.  EKG on 06/13/13 showed ST @ 109 bopm, possible anterior infarct (age undetermined). Currently, there are no comparison EKGs available.  He told his PAT that he tends to stay tachycardic, but otherwise denied any CV symtpoms or prior cardiac testing.  CXR on 06/13/13 showed: Hyperexpansion of the lungs consistent with chronic obstructive pulmonary disease. No acute cardiopulmonary abnormality seen.  He had mild airway obstruction on spirometry on 01/22/11. FVC 5.38 (99%), FEV1 3.72 (88%), FEF 25-75% 2.39 (62%).  Preoperative labs noted.   Further evaluation by his assigned anesthesiologist on the day of surgery. If no acute changes then I anticipate he could proceed as planned.  George Hugh [facility] Stay [facility]/Anesthesiology Phone [phone] 06/13/2013 12:07 PM

## 2013-06-14 ENCOUNTER — Ambulatory Visit (HOSPITAL_COMMUNITY)
Admission: RE | Admit: 2013-06-14 | Discharge: 2013-06-14 | Disposition: A | Discharge status: Home / Self Care | Payer: Medicare Other | Source: Ambulatory Visit | Attending: Otolaryngology | Admitting: Otolaryngology

## 2013-06-14 ENCOUNTER — Encounter (HOSPITAL_COMMUNITY): Payer: Medicare Other | Admitting: Vascular Surgery

## 2013-06-14 ENCOUNTER — Ambulatory Visit (HOSPITAL_COMMUNITY): Payer: Medicare Other | Admitting: Certified Registered"

## 2013-06-14 ENCOUNTER — Encounter (HOSPITAL_COMMUNITY)
Admission: RE | Disposition: A | Discharge status: Home / Self Care | Payer: Medicare Other | Source: Ambulatory Visit | Attending: Otolaryngology

## 2013-06-14 ENCOUNTER — Encounter (HOSPITAL_COMMUNITY): Payer: Self-pay | Admitting: *Deleted

## 2013-06-14 DIAGNOSIS — M069 Rheumatoid arthritis, unspecified: Secondary | ICD-10-CM | POA: Insufficient documentation

## 2013-06-14 DIAGNOSIS — J342 Deviated nasal septum: Secondary | ICD-10-CM | POA: Insufficient documentation

## 2013-06-14 DIAGNOSIS — Z01818 Encounter for other preprocedural examination: Secondary | ICD-10-CM | POA: Insufficient documentation

## 2013-06-14 DIAGNOSIS — J438 Other emphysema: Secondary | ICD-10-CM | POA: Insufficient documentation

## 2013-06-14 DIAGNOSIS — M1990 Unspecified osteoarthritis, unspecified site: Secondary | ICD-10-CM | POA: Insufficient documentation

## 2013-06-14 DIAGNOSIS — G8929 Other chronic pain: Secondary | ICD-10-CM | POA: Insufficient documentation

## 2013-06-14 DIAGNOSIS — K219 Gastro-esophageal reflux disease without esophagitis: Secondary | ICD-10-CM | POA: Insufficient documentation

## 2013-06-14 DIAGNOSIS — J3489 Other specified disorders of nose and nasal sinuses: Secondary | ICD-10-CM | POA: Insufficient documentation

## 2013-06-14 DIAGNOSIS — Z7982 Long term (current) use of aspirin: Secondary | ICD-10-CM | POA: Insufficient documentation

## 2013-06-14 DIAGNOSIS — F329 Major depressive disorder, single episode, unspecified: Secondary | ICD-10-CM | POA: Insufficient documentation

## 2013-06-14 DIAGNOSIS — Z01812 Encounter for preprocedural laboratory examination: Secondary | ICD-10-CM | POA: Insufficient documentation

## 2013-06-14 DIAGNOSIS — F3289 Other specified depressive episodes: Secondary | ICD-10-CM | POA: Insufficient documentation

## 2013-06-14 DIAGNOSIS — E785 Hyperlipidemia, unspecified: Secondary | ICD-10-CM | POA: Insufficient documentation

## 2013-06-14 DIAGNOSIS — F411 Generalized anxiety disorder: Secondary | ICD-10-CM | POA: Insufficient documentation

## 2013-06-14 DIAGNOSIS — Z01810 Encounter for preprocedural cardiovascular examination: Secondary | ICD-10-CM | POA: Insufficient documentation

## 2013-06-14 DIAGNOSIS — F17200 Nicotine dependence, unspecified, uncomplicated: Secondary | ICD-10-CM | POA: Insufficient documentation

## 2013-06-14 HISTORY — PX: NASAL SEPTOPLASTY W/ TURBINOPLASTY: SHX2070

## 2013-06-14 SURGERY — SEPTOPLASTY, NOSE, WITH NASAL TURBINATE REDUCTION
Anesthesia: General | Site: Nose | Laterality: Bilateral

## 2013-06-14 MED ORDER — NEOSTIGMINE METHYLSULFATE 1 MG/ML IJ SOLN
INTRAMUSCULAR | Status: DC | PRN
  Administered 2013-06-14: 3 mg via INTRAVENOUS

## 2013-06-14 MED ORDER — ARTIFICIAL TEARS OP OINT
TOPICAL_OINTMENT | OPHTHALMIC | Status: DC | PRN
  Administered 2013-06-14: 1 via OPHTHALMIC

## 2013-06-14 MED ORDER — OXYCODONE HCL 5 MG PO TABS
ORAL_TABLET | ORAL | Status: AC
  Filled 2013-06-14: qty 1

## 2013-06-14 MED ORDER — HYDROCODONE-ACETAMINOPHEN 5-325 MG PO TABS: 1.0000 | ORAL_TABLET | Freq: Four times a day (QID) | ORAL | Status: DC | PRN

## 2013-06-14 MED ORDER — PROPOFOL 10 MG/ML IV BOLUS
INTRAVENOUS | Status: DC | PRN
  Administered 2013-06-14: 160 mg via INTRAVENOUS

## 2013-06-14 MED ORDER — ONDANSETRON HCL 4 MG/2ML IJ SOLN
INTRAMUSCULAR | Status: AC
  Filled 2013-06-14: qty 2

## 2013-06-14 MED ORDER — FENTANYL CITRATE 0.05 MG/ML IJ SOLN
INTRAMUSCULAR | Status: AC
  Filled 2013-06-14: qty 5

## 2013-06-14 MED ORDER — ACETAMINOPHEN 325 MG PO TABS: 325.0000 mg | ORAL_TABLET | ORAL | Status: DC | PRN

## 2013-06-14 MED ORDER — PROPOFOL 10 MG/ML IV BOLUS
INTRAVENOUS | Status: AC
  Filled 2013-06-14: qty 20

## 2013-06-14 MED ORDER — MIDAZOLAM HCL 5 MG/5ML IJ SOLN
INTRAMUSCULAR | Status: DC | PRN
  Administered 2013-06-14: 2 mg via INTRAVENOUS

## 2013-06-14 MED ORDER — ROCURONIUM BROMIDE 50 MG/5ML IV SOLN
INTRAVENOUS | Status: AC
  Filled 2013-06-14: qty 1

## 2013-06-14 MED ORDER — ARTIFICIAL TEARS OP OINT
TOPICAL_OINTMENT | OPHTHALMIC | Status: AC
  Filled 2013-06-14: qty 3.5

## 2013-06-14 MED ORDER — GLYCOPYRROLATE 0.2 MG/ML IJ SOLN
INTRAMUSCULAR | Status: DC | PRN
  Administered 2013-06-14: .6 mg via INTRAVENOUS

## 2013-06-14 MED ORDER — PHENYLEPHRINE HCL 10 MG/ML IJ SOLN
INTRAMUSCULAR | Status: DC | PRN
  Administered 2013-06-14: 80 ug via INTRAVENOUS

## 2013-06-14 MED ORDER — LIDOCAINE HCL (CARDIAC) 20 MG/ML IV SOLN
INTRAVENOUS | Status: AC
  Filled 2013-06-14: qty 5

## 2013-06-14 MED ORDER — LIDOCAINE HCL (CARDIAC) 20 MG/ML IV SOLN
INTRAVENOUS | Status: DC | PRN
  Administered 2013-06-14: 50 mg via INTRAVENOUS

## 2013-06-14 MED ORDER — LACTATED RINGERS IV SOLN
INTRAVENOUS | Status: DC | PRN
  Administered 2013-06-14 (×2): via INTRAVENOUS

## 2013-06-14 MED ORDER — ROCURONIUM BROMIDE 100 MG/10ML IV SOLN
INTRAVENOUS | Status: DC | PRN
  Administered 2013-06-14: 50 mg via INTRAVENOUS

## 2013-06-14 MED ORDER — FENTANYL CITRATE 0.05 MG/ML IJ SOLN
25.0000 ug | INTRAMUSCULAR | Status: DC | PRN
Start: 2013-06-14 — End: 2013-06-14
  Administered 2013-06-14 (×2): 50 ug via INTRAVENOUS

## 2013-06-14 MED ORDER — OXYCODONE HCL 5 MG PO TABS
5.0000 mg | ORAL_TABLET | Freq: Once | ORAL | Status: AC | PRN
  Administered 2013-06-14: 5 mg via ORAL

## 2013-06-14 MED ORDER — AMOXICILLIN-POT CLAVULANATE 500-125 MG PO TABS: 1.0000 | ORAL_TABLET | Freq: Two times a day (BID) | ORAL | Status: DC

## 2013-06-14 MED ORDER — FENTANYL CITRATE 0.05 MG/ML IJ SOLN
INTRAMUSCULAR | Status: AC
  Administered 2013-06-14: 50 ug via INTRAVENOUS
  Filled 2013-06-14: qty 2

## 2013-06-14 MED ORDER — PROMETHAZINE HCL 25 MG/ML IJ SOLN: 6.2500 mg | INTRAMUSCULAR | Status: DC | PRN

## 2013-06-14 MED ORDER — NEOSTIGMINE METHYLSULFATE 1 MG/ML IJ SOLN
INTRAMUSCULAR | Status: AC
  Filled 2013-06-14: qty 10

## 2013-06-14 MED ORDER — OXYCODONE HCL 5 MG/5ML PO SOLN: 5.0000 mg | Freq: Once | ORAL | Status: AC | PRN

## 2013-06-14 MED ORDER — MUPIROCIN CALCIUM 2 % EX CREA
TOPICAL_CREAM | CUTANEOUS | Status: DC | PRN
  Administered 2013-06-14: 1 via TOPICAL

## 2013-06-14 MED ORDER — DEXAMETHASONE SODIUM PHOSPHATE 10 MG/ML IJ SOLN
INTRAMUSCULAR | Status: DC | PRN
  Administered 2013-06-14: 10 mg via INTRAVENOUS

## 2013-06-14 MED ORDER — LIDOCAINE-EPINEPHRINE 1 %-1:100000 IJ SOLN
INTRAMUSCULAR | Status: AC
  Filled 2013-06-14: qty 1

## 2013-06-14 MED ORDER — FENTANYL CITRATE 0.05 MG/ML IJ SOLN
INTRAMUSCULAR | Status: DC | PRN
  Administered 2013-06-14: 100 ug via INTRAVENOUS
  Administered 2013-06-14 (×3): 50 ug via INTRAVENOUS

## 2013-06-14 MED ORDER — 0.9 % SODIUM CHLORIDE (POUR BTL) OPTIME
TOPICAL | Status: DC | PRN
  Administered 2013-06-14: 1000 mL

## 2013-06-14 MED ORDER — LIDOCAINE-EPINEPHRINE 1 %-1:100000 IJ SOLN
INTRAMUSCULAR | Status: DC | PRN
  Administered 2013-06-14: 20 mL

## 2013-06-14 MED ORDER — ACETAMINOPHEN 160 MG/5ML PO SOLN
325.0000 mg | ORAL | Status: DC | PRN
  Filled 2013-06-14: qty 20.3

## 2013-06-14 MED ORDER — GLYCOPYRROLATE 0.2 MG/ML IJ SOLN
INTRAMUSCULAR | Status: AC
  Filled 2013-06-14: qty 3

## 2013-06-14 MED ORDER — MUPIROCIN CALCIUM 2 % EX CREA
TOPICAL_CREAM | CUTANEOUS | Status: AC
  Filled 2013-06-14: qty 15

## 2013-06-14 MED ORDER — ONDANSETRON HCL 4 MG/2ML IJ SOLN
INTRAMUSCULAR | Status: DC | PRN
  Administered 2013-06-14: 4 mg via INTRAVENOUS

## 2013-06-14 MED ORDER — MIDAZOLAM HCL 2 MG/2ML IJ SOLN
INTRAMUSCULAR | Status: AC
  Filled 2013-06-14: qty 2

## 2013-06-14 MED ORDER — OXYMETAZOLINE HCL 0.05 % NA SOLN
NASAL | Status: AC
  Filled 2013-06-14: qty 15

## 2013-06-14 MED ORDER — OXYMETAZOLINE HCL 0.05 % NA SOLN
NASAL | Status: DC | PRN
  Administered 2013-06-14: 1 via NASAL

## 2013-06-14 SURGICAL SUPPLY — 19 items
CANISTER SUCTION 2500CC (MISCELLANEOUS) ×2 IMPLANT
GAUZE SPONGE 2X2 8PLY STRL LF (GAUZE/BANDAGES/DRESSINGS) ×1 IMPLANT
GLOVE BIOGEL M 7.0 STRL (GLOVE) ×4 IMPLANT
GOWN STRL REUS W/ TWL LRG LVL3 (GOWN DISPOSABLE) ×2 IMPLANT
GOWN STRL REUS W/TWL LRG LVL3 (GOWN DISPOSABLE) ×4
KIT BASIN OR (CUSTOM PROCEDURE TRAY) ×2 IMPLANT
KIT ROOM TURNOVER OR (KITS) ×2 IMPLANT
NS IRRIG 1000ML POUR BTL (IV SOLUTION) ×2 IMPLANT
PAD ARMBOARD 7.5X6 YLW CONV (MISCELLANEOUS) ×2 IMPLANT
SPLINT NASAL DOYLE BI-VL (GAUZE/BANDAGES/DRESSINGS) ×2 IMPLANT
SPONGE GAUZE 2X2 STER 10/PKG (GAUZE/BANDAGES/DRESSINGS) ×1
SPONGE NEURO XRAY DETECT 1X3 (DISPOSABLE) ×2 IMPLANT
SUT ETHILON 3 0 PS 1 (SUTURE) ×2 IMPLANT
SUT PLAIN 4 0 ~~LOC~~ 1 (SUTURE) ×2 IMPLANT
TOWEL OR 17X24 6PK STRL BLUE (TOWEL DISPOSABLE) ×2 IMPLANT
TOWEL OR 17X26 10 PK STRL BLUE (TOWEL DISPOSABLE) ×2 IMPLANT
TRAY ENT MC OR (CUSTOM PROCEDURE TRAY) ×2 IMPLANT
TUBE SALEM SUMP 16 FR W/ARV (TUBING) ×2 IMPLANT
TUBING EXTENTION W/L.L. (IV SETS) ×2 IMPLANT

## 2013-06-14 NOTE — Brief Op Note (Signed)
06/14/2013  8:57 AM  PATIENT:  Austin Frazier  58 y.o. male  PRE-OPERATIVE DIAGNOSIS:  Deviated septum  POST-OPERATIVE DIAGNOSIS:  Deviated septum  PROCEDURE:  Procedure(s): NASAL SEPTOPLASTY WITH TURBINATE REDUCTION (Bilateral)  SURGEON:  Surgeon(s) and Role:    * Jerrell Belfast, MD - Primary  PHYSICIAN ASSISTANT:   ASSISTANTS: none   ANESTHESIA:   general  EBL:  Total I/O In: 1000 [I.V.:1000] Out: - 50cc  BLOOD ADMINISTERED:none  DRAINS: none   LOCAL MEDICATIONS USED:  LIDOCAINE  and Amount: 8 ml  SPECIMEN:  No Specimen  DISPOSITION OF SPECIMEN:  N/A  COUNTS:  YES  TOURNIQUET:  * No tourniquets in log *  DICTATION: .Other Dictation: Dictation Number [id number]  PLAN OF CARE: Discharge to home after PACU  PATIENT DISPOSITION:  PACU - hemodynamically stable.   Delay start of Pharmacological VTE agent (>24hrs) due to surgical blood loss or risk of bleeding: not applicable

## 2013-06-14 NOTE — Progress Notes (Signed)
Pt requesting percocet script instead of vicodin. Dr. Wilburn Cornelia notified. No new orders.

## 2013-06-14 NOTE — Op Note (Signed)
Austin Frazier, Austin Frazier                  ACCOUNT NO.:  [id number]  MEDICAL RECORD NO.:  [id number]  LOCATION:  [facility]                         FACILITY:  [facility]  PHYSICIAN:  Early Chars. Wilburn Cornelia, M.D.DATE OF BIRTH:  [birth year]  DATE OF PROCEDURE:  06/14/2013 DATE OF DISCHARGE:  06/14/2013                              OPERATIVE REPORT   PREOPERATIVE DIAGNOSES: 1. Nasal airway obstruction. 2. Deviated nasal septum. 3. History of previous sinonasal surgery.  POSTOPERATIVE DIAGNOSES: 1. Nasal airway obstruction. 2. Deviated nasal septum. 3. History of previous sinonasal surgery.  INDICATION FOR SURGERY: 1. Nasal airway obstruction. 2. Deviated nasal septum. 3. History of previous sinonasal surgery.  SURGICAL PROCEDURE: 1. Revision nasal septoplasty and bilateral inferior turbinates. 2. Bilateral inferior turbinate reduction.  ANESTHESIA:  General endotracheal.  SURGEON:  Early Chars. Wilburn Cornelia, MD  COMPLICATIONS:  None.  ESTIMATED BLOOD LOSS:  Less than 50 mL.  The patient transferred from the operating room to the recovery room in stable condition.  BRIEF HISTORY:  The patient is a 58 year old male, who has been followed in our office with a longstanding history of progressive nasal airway obstruction.  He had undergone previous nasal septoplasty and had suffered a subsequent nasal trauma.  The patient had significant issues with nasal airway obstruction and severe nasal congestion which did not respond to appropriate medical therapy.  Examination showed a deviated septum with S-shaped cartilage curvature and bilateral nasal blockage as well as inferior turbinate hypertrophy.  Given his history and findings and failure to respond to appropriate medical therapy, I recommended that we undertake revision nasal septoplasty and bilateral inferior turbinate reduction.  The risks and benefits of procedure were discussed in detail with the patient who understood and concurred with our  plan for surgery which is scheduled under general anesthesia as an outpatient at [facility].  DESCRIPTION OF PROCEDURE:  The patient was brought to the operating room on June 14, 2013, placed in supine position on the operating table. General endotracheal anesthesia was established without difficulty. When the patient was adequately anesthetized, he was positioned, prepped and draped.  The nose injected with 8 mL of 1% lidocaine and 1:100,000 solution of epinephrine injected in submucosal fashion along the nasal septum and inferior turbinates bilaterally.  The patient's nose was then packed with Afrin-soaked cottonoid pledgets which were left in place for approximately 10 minutes to allow for vasoconstriction and hemostasis. The patient was positioned, prepped and draped.  The surgical procedure was begun.  A left anterior hemitransfixion incision was created and mucoperichondrial flap was elevated on the left-hand side.  The patient had undergone previous nasal septal surgery.  There was a moderate amount of scar tissue and adhesion between the 2 mucosal flaps but they were elevated without difficulty, and no mucosal tears during the operation.  The residual septal cartilage which was quite deviated was carefully mobilized and resected.  This was later morselized and returned to the mucoperichondrial pocket.  Working from anterior to posterior, the patient had severely deviated nasal septum with bone spurs posteriorly and inferiorly.  These were mobilized with an elevator and then resected bringing the septum to a good midline position.  The resected cartilage was morselized, returned to the mucoperichondrial pocket and the flaps were reapproximated with a 4-0 gut suture on a Keith needle in a horizontal mattressing fashion.  The anterior hemitransfixion incision was closed with the same stitch, and at the conclusion of the procedure, bilateral Doyle nasal septal  splints were placed after the application of Bactroban ointment and sutured in position with a 3-0 Ethilon suture.  Inferior turbinate reduction was then undertaken with bipolar cautery set at 12 watts.  Two submucosal passes were made in each inferior turbinate.  When the turbinates had been adequately cauterized, anterior incisions were created overlying the soft tissue, elevated and a small amount of turbinate bone was resected.  Turbinates were then outfractured creating more patent nasal cavity.  Nasal cavity was irrigated and suctioned.  There was no bleeding. Orogastric tube was passed.  Stomach contents were aspirated.  The patient was then awakened from his anesthetic. He was extubated and transferred from the operating room to the recovery room in stable condition.  Blood loss less than 50 mL.  No complications.          ______________________________ Early Chars Wilburn Cornelia, M.D.     DLS/MEDQ  D:  81/11/3157  T:  06/14/2013  Job:  [id number]

## 2013-06-14 NOTE — Addendum Note (Signed)
Addended by: Yadir Zentner on: 06/14/2013 07:22 AM   Modules accepted: Orders  

## 2013-06-14 NOTE — Discharge Instructions (Signed)

## 2013-06-14 NOTE — Anesthesia Postprocedure Evaluation (Signed)
  Anesthesia Post-op Note  Patient: Austin Frazier  Procedure(s) Performed: Procedure(s): NASAL SEPTOPLASTY WITH TURBINATE REDUCTION (Bilateral)  Patient Location: PACU  Anesthesia Type:General  Level of Consciousness: awake and alert   Airway and Oxygen Therapy: Patient Spontanous Breathing  Post-op Pain: none  Post-op Assessment: Post-op Vital signs reviewed, Patient's Cardiovascular Status Stable, Respiratory Function Stable, Patent Airway, No signs of Nausea or vomiting and Pain level controlled  Post-op Vital Signs: Reviewed and stable  Last Vitals:  Filed Vitals:   06/14/13 0925  BP: 113/86  Pulse: 96  Temp: 36.7 C  Resp: 18    Complications: No apparent anesthesia complications

## 2013-06-14 NOTE — Anesthesia Preprocedure Evaluation (Signed)
Anesthesia Evaluation  Patient identified by MRN, date of birth, ID band  Reviewed: Allergy & Precautions, H&P , NPO status , Patient's Chart, lab work & pertinent test results  History of Anesthesia Complications Negative for: history of anesthetic complications  Airway Mallampati: II      Dental  (+) Teeth Intact,    Pulmonary neg sleep apnea, COPD COPD inhaler, neg recent URI, Current Smoker,  breath sounds clear to auscultation        Cardiovascular negative cardio ROS  Rhythm:Regular     Neuro/Psych PSYCHIATRIC DISORDERS Anxiety Depression negative neurological ROS     GI/Hepatic Neg liver ROS, GERD-  Medicated and Controlled,  Endo/Other  negative endocrine ROS  Renal/GU negative Renal ROS     Musculoskeletal  (+) Arthritis -, Osteoarthritis,    Abdominal   Peds  Hematology negative hematology ROS (+)   Anesthesia Other Findings   Reproductive/Obstetrics                           Anesthesia Physical Anesthesia Plan  ASA: II  Anesthesia Plan: General   Post-op Pain Management:    Induction: Intravenous  Airway Management Planned: Oral ETT  Additional Equipment: None  Intra-op Plan:   Post-operative Plan: Extubation in OR  Informed Consent: I have reviewed the patients History and Physical, chart, labs and discussed the procedure including the risks, benefits and alternatives for the proposed anesthesia with the patient or authorized representative who has indicated his/her understanding and acceptance.   Dental advisory given  Plan Discussed with: CRNA and Surgeon  Anesthesia Plan Comments:         Anesthesia Quick Evaluation

## 2013-06-14 NOTE — Anesthesia Procedure Notes (Signed)
Procedure Name: Intubation Date/Time: 06/14/2013 7:50 AM Performed by: Scheryl Darter Pre-anesthesia Checklist: Patient identified, Emergency Drugs available, Suction available, Patient being monitored and Timeout performed Patient Re-evaluated:Patient Re-evaluated prior to inductionOxygen Delivery Method: Circle system utilized Preoxygenation: Pre-oxygenation with 100% oxygen Intubation Type: IV induction Ventilation: Mask ventilation without difficulty Laryngoscope Size: Miller and 3 Grade View: Grade II Tube type: Oral Tube size: 7.5 mm Number of attempts: 1 Airway Equipment and Method: Stylet Placement Confirmation: ETT inserted through vocal cords under direct vision,  positive ETCO2,  CO2 detector and breath sounds checked- equal and bilateral Secured at: 7.5 cm Tube secured with: Tape Dental Injury: Teeth and Oropharynx as per pre-operative assessment

## 2013-06-14 NOTE — H&P (Signed)
Austin Frazier is an 58 y.o. male.   Chief Complaint: Nasal Obstruction HPI: Prog sx of nasal obstruction  Past Medical History  Diagnosis Date  . Back pain, chronic     RA and Buldging disc  . Hyperlipemia     takes Vytorin daily  . Basal cell carcinoma   . Chronic sinusitis     uses Nasonex daily  . Anxiety     takes Xanax daily as needed  . Insomnia     takes Lunesta nightly  . Family history of anesthesia complication     pts mom felt everything during a back surgery  . History of bronchitis     last time 23yr ago  . Numbness     in right leg d/t back problems  . Rheumatoid arthritis   . GERD (gastroesophageal reflux disease)     takes Nexium daily as needed  . History of colon polyps   . Diverticulosis   . Urinary urgency   . Cataracts, bilateral     immature  . Depression     but doesn't take any meds  . COPD, mild     uses Albuterol daily as needed and Spiriva daily  . Emphysema/COPD     Past Surgical History  Procedure Laterality Date  . Ingrown toenails Bilateral at age [age] . Arm surgery Left in his teenage yrs  . Skin cancer removed      basal cell carcinoma  . Finger surgery Right   . Colonoscopy      Family History  Problem Relation Age of Onset  . Diabetes Father   . Stroke Mother   . Diabetes Brother   . Hyperlipidemia Mother   . Hypertension Mother   . Heart attack Mother    Social History:  reports that he has been smoking.  He does not have any smokeless tobacco history on file. He reports that he drinks alcohol. He reports that he does not use illicit drugs.  Allergies: No Known Allergies  Medications Prior to Admission  Medication Sig Dispense Refill  . albuterol (PROVENTIL HFA;VENTOLIN HFA) 108 (90 BASE) MCG/ACT inhaler Inhale 2 puffs into the lungs every 6 (six) hours as needed for shortness of breath.       . ALPRAZolam (XANAX) 0.5 MG tablet Take 0.5 mg by mouth 3 (three) times daily as needed for anxiety.       .Marland Kitchenascorbic acid  (VITAMIN C) 1000 MG tablet Take 1,000 mg by mouth daily.       .Marland Kitchenaspirin 81 MG tablet Take 81 mg by mouth daily.        .Marland Kitchenesomeprazole (NEXIUM) 40 MG capsule Take 40 mg by mouth daily at 12 noon.      . Eszopiclone (ESZOPICLONE) 3 MG TABS Take 3 mg by mouth at bedtime. Take immediately before bedtime      . ezetimibe-simvastatin (VYTORIN) 10-20 MG per tablet Take 1 tablet by mouth every morning.       .Marland KitchenHYDROcodone-acetaminophen (NORCO) 10-325 MG per tablet Take 1 tablet by mouth every 6 (six) hours as needed for moderate pain.       . mometasone (NASONEX) 50 MCG/ACT nasal spray Place 2 sprays into the nose daily.      . Multiple Vitamin (MULTIVITAMIN WITH MINERALS) TABS Take 1 tablet by mouth daily.      .Marland Kitchentiotropium (SPIRIVA) 18 MCG inhalation capsule Place 18 mcg into inhaler and inhale daily.      . [  DISCONTINUED] tiotropium (SPIRIVA HANDIHALER) 18 MCG inhalation capsule Place 1 capsule (18 mcg total) into inhaler and inhale daily.  30 capsule  11    Results for orders placed during the hospital encounter of 06/13/13 (from the past 48 hour(s))  BASIC METABOLIC PANEL     Status: Abnormal   Collection Time    06/13/13 10:09 AM      Result Value Ref Range   Sodium 140  137 - 147 mEq/L   Potassium 4.6  3.7 - 5.3 mEq/L   Chloride 101  96 - 112 mEq/L   CO2 24  19 - 32 mEq/L   Glucose, Bld 102 (*) 70 - 99 mg/dL   BUN 13  6 - 23 mg/dL   Creatinine, Ser 0.86  0.50 - 1.35 mg/dL   Calcium 10.0  8.4 - 10.5 mg/dL   GFR calc non Af Amer >90  >90 mL/min   GFR calc Af Amer >90  >90 mL/min   Comment: (NOTE)     The eGFR has been calculated using the CKD EPI equation.     This calculation has not been validated in all clinical situations.     eGFR's persistently <90 mL/min signify possible Chronic Kidney     Disease.  CBC     Status: None   Collection Time    06/13/13 10:09 AM      Result Value Ref Range   WBC 6.6  4.0 - 10.5 K/uL   RBC 4.99  4.22 - 5.81 MIL/uL   Hemoglobin 16.0  13.0 - 17.0  g/dL   HCT 46.9  39.0 - 52.0 %   MCV 94.0  78.0 - 100.0 fL   MCH 32.1  26.0 - 34.0 pg   MCHC 34.1  30.0 - 36.0 g/dL   RDW 12.2  11.5 - 15.5 %   Platelets 231  150 - 400 K/uL   Dg Chest 2 View  06/13/2013   CLINICAL DATA:  Chronic obstructive pulmonary disease.  EXAM: CHEST  2 VIEW  COMPARISON:  January 22, 2011.  FINDINGS: Cardiomediastinal silhouette appears normal. Hyperexpansion of the lungs is noted consistent with chronic obstructive pulmonary disease. No acute pulmonary disease is noted. No pneumothorax or pleural effusion is noted. Bony thorax is intact.  IMPRESSION: Hyperexpansion of the lungs consistent with chronic obstructive pulmonary disease. No acute cardiopulmonary abnormality seen.   Electronically Signed   By: Sabino Dick M.D.   On: 06/13/2013 10:47    Review of Systems  Constitutional: Negative.   Respiratory: Negative.   Cardiovascular: Negative.   Gastrointestinal: Negative.   Musculoskeletal: Negative.     Blood pressure 135/87, pulse 103, temperature 97.6 F (36.4 C), temperature source Oral, resp. rate 20, height _0  (1.88 m), weight 79.833 kg (176 lb), SpO2 100.00%. Physical Exam  Constitutional: He is oriented to person, place, and time. He appears well-developed and well-nourished.  Neck: Normal range of motion. Neck supple.  Cardiovascular: Normal rate.   Respiratory: Effort normal.  GI: Soft.  Musculoskeletal: Normal range of motion.  Neurological: He is alert and oriented to person, place, and time.     Assessment/Plan Adm for OP septoplasty and IT reduction  Jerrell Belfast 06/14/2013, 7:23 AM

## 2013-06-14 NOTE — Transfer of Care (Signed)
Immediate Anesthesia Transfer of Care Note  Patient: Austin Frazier  Procedure(s) Performed: Procedure(s): NASAL SEPTOPLASTY WITH TURBINATE REDUCTION (Bilateral)  Patient Location: PACU  Anesthesia Type:General  Level of Consciousness: awake, alert , oriented and patient cooperative  Airway & Oxygen Therapy: Patient Spontanous Breathing and Patient connected to face mask oxygen  Post-op Assessment: Report given to PACU RN, Post -op Vital signs reviewed and stable and Patient moving all extremities  Post vital signs: Reviewed and stable  Complications: No apparent anesthesia complications

## 2013-06-15 ENCOUNTER — Telehealth: Payer: Self-pay | Admitting: Pulmonary Disease

## 2013-06-15 NOTE — Telephone Encounter (Signed)
Called spoke with pt. Pt had deviated septum surgery yesterday by Dr. Wilburn Cornelia. Had CXR 2 days ago.  Pt reports he saw Pulmonologist in [place] but is going to come back to [facility]. He did not like the service there and was not "bright" at all. He is going to call us back for his appt once he recovers from his surgery. Will forward to [facility] so he is aware as an [place]

## 2013-06-15 NOTE — Telephone Encounter (Signed)
Noted  

## 2013-06-19 ENCOUNTER — Encounter (HOSPITAL_COMMUNITY): Payer: Self-pay | Admitting: Otolaryngology

## 2014-02-27 DIAGNOSIS — J449 Chronic obstructive pulmonary disease, unspecified: Secondary | ICD-10-CM | POA: Diagnosis not present

## 2014-02-27 DIAGNOSIS — F419 Anxiety disorder, unspecified: Secondary | ICD-10-CM | POA: Diagnosis not present

## 2014-02-27 DIAGNOSIS — F17200 Nicotine dependence, unspecified, uncomplicated: Secondary | ICD-10-CM | POA: Diagnosis not present

## 2014-02-27 DIAGNOSIS — R634 Abnormal weight loss: Secondary | ICD-10-CM | POA: Diagnosis not present

## 2014-02-27 DIAGNOSIS — E782 Mixed hyperlipidemia: Secondary | ICD-10-CM | POA: Diagnosis not present

## 2014-02-27 DIAGNOSIS — Z0000 Encounter for general adult medical examination without abnormal findings: Secondary | ICD-10-CM | POA: Diagnosis not present

## 2014-02-27 DIAGNOSIS — R030 Elevated blood-pressure reading, without diagnosis of hypertension: Secondary | ICD-10-CM | POA: Diagnosis not present

## 2015-01-27 ENCOUNTER — Other Ambulatory Visit: Payer: Self-pay | Admitting: Surgery

## 2015-02-19 ENCOUNTER — Encounter (HOSPITAL_BASED_OUTPATIENT_CLINIC_OR_DEPARTMENT_OTHER): Payer: Self-pay | Admitting: *Deleted

## 2015-02-25 NOTE — H&P (Signed)
  Austin Frazier  Location: [facility] Patient #: [id number] DOB: [birth year] Single / Language: Cleophus Molt / Race: White Male   History of Present Illness Patient words: new-gallbladder.  The patient is a 60 year old male who presents with an inguinal hernia. This is a pleasant gentleman referred by Dr. Willey Blade for evaluation of a symptomatic right inguinal hernia. The patient had done a lot of lifting moving and developed tenderness in his right inguinal area. He now has developed a reducible bulge in that area. The pain is mild to moderate. He reports that the bulge reduces when lying. He has had no obstructive symptoms. He is otherwise healthy without complaints.   Allergies  No Known Drug Allergies12/06/2014  Medication History  Hydrocodone-Acetaminophen (10-325MG  Tablet, Oral) Active. ALPRAZolam (0.5MG  Tablet, Oral) Active. Eszopiclone (3MG  Tablet, Oral) Active. Spiriva HandiHaler (18MCG Capsule, Inhalation) Active. Rosuvastatin Calcium (10MG  Tablet, Oral) Active. Ventolin HFA (108 (90 Base)MCG/ACT Aerosol Soln, Inhalation) Active. Vitamin C (1000MG  Tablet, Oral) Active.  Vitals  01/27/2015 1:30 PM Weight: 170.8 lb Temp.: 58F(Oral)  Pulse: 72 (Regular)  BP: 124/82 (Sitting, Left Arm, Standard)       Physical Exam  General Mental Status-Alert. General Appearance-Consistent with stated age. Hydration-Well hydrated. Voice-Normal.  Head and Neck Head-normocephalic, atraumatic with no lesions or palpable masses. Trachea-midline.  Eye Eyeball - Bilateral-Extraocular movements intact. Sclera/Conjunctiva - Bilateral-No scleral icterus.  Chest and Lung Exam Chest and lung exam reveals -quiet, even and easy respiratory effort with no use of accessory muscles and on auscultation, normal breath sounds, no adventitious sounds and normal vocal resonance. Inspection Chest Wall - Normal. Back -  normal.  Cardiovascular Cardiovascular examination reveals -normal heart sounds, regular rate and rhythm with no murmurs and normal pedal pulses bilaterally.  Abdomen Inspection Skin - Scar - no surgical scars. Hernias - Inguinal hernia - Right - Reducible. Note: It is a moderate size, reducible right inguinal hernia. Palpation/Percussion Palpation and Percussion of the abdomen reveal - Soft, Non Tender, No Rebound tenderness, No Rigidity (guarding) and No hepatosplenomegaly. Auscultation Auscultation of the abdomen reveals - Bowel sounds normal.  Neurologic Neurologic evaluation reveals -alert and oriented x 3 with no impairment of recent or remote memory. Mental Status-Normal.  Musculoskeletal Normal Exam - Left-Upper Extremity Strength Normal and Lower Extremity Strength Normal. Normal Exam - Right-Upper Extremity Strength Normal, Lower Extremity Weakness.    Assessment & Plan   RIGHT INGUINAL HERNIA (K40.90)  Impression: I discussed the diagnosis with him in detail. I discussed hernia repair with mesh. I discussed both the open and laparoscopic techniques. He wishes to proceed with a laparoscopic right inguinal hernia repair with mesh. I gave him literature regarding this. I discussed the risks of surgery. These include but are not limited to bleeding, infection, injury to surrounding structures, nerve entrapment, chronic pain, recurrence, etc. I also discussed postoperative recovery. He understands and wishes to proceed with surgery Current Plans Pt Education - Pamphlet Given - Laparoscopic Hernia Repair: discussed with patient and provided information.

## 2015-02-26 ENCOUNTER — Encounter (HOSPITAL_BASED_OUTPATIENT_CLINIC_OR_DEPARTMENT_OTHER): Payer: Self-pay | Admitting: Certified Registered"

## 2015-02-26 ENCOUNTER — Ambulatory Visit (HOSPITAL_BASED_OUTPATIENT_CLINIC_OR_DEPARTMENT_OTHER): Payer: Medicare Other | Admitting: Certified Registered"

## 2015-02-26 ENCOUNTER — Ambulatory Visit (HOSPITAL_BASED_OUTPATIENT_CLINIC_OR_DEPARTMENT_OTHER)
Admission: RE | Admit: 2015-02-26 | Discharge: 2015-02-26 | Disposition: A | Discharge status: Home / Self Care | Payer: Medicare Other | Source: Ambulatory Visit | Attending: Surgery | Admitting: Surgery

## 2015-02-26 ENCOUNTER — Encounter (HOSPITAL_BASED_OUTPATIENT_CLINIC_OR_DEPARTMENT_OTHER)
Admission: RE | Disposition: A | Discharge status: Home / Self Care | Payer: Self-pay | Source: Ambulatory Visit | Attending: Surgery

## 2015-02-26 DIAGNOSIS — K4090 Unilateral inguinal hernia, without obstruction or gangrene, not specified as recurrent: Secondary | ICD-10-CM | POA: Diagnosis present

## 2015-02-26 DIAGNOSIS — J449 Chronic obstructive pulmonary disease, unspecified: Secondary | ICD-10-CM | POA: Insufficient documentation

## 2015-02-26 DIAGNOSIS — F17200 Nicotine dependence, unspecified, uncomplicated: Secondary | ICD-10-CM | POA: Diagnosis not present

## 2015-02-26 HISTORY — PX: INSERTION OF MESH: SHX5868

## 2015-02-26 HISTORY — DX: Unilateral inguinal hernia, without obstruction or gangrene, not specified as recurrent: K40.90

## 2015-02-26 HISTORY — PX: INGUINAL HERNIA REPAIR: SHX194

## 2015-02-26 SURGERY — REPAIR, HERNIA, INGUINAL, LAPAROSCOPIC
Anesthesia: General | Site: Groin | Laterality: Right

## 2015-02-26 MED ORDER — LIDOCAINE HCL (PF) 1 % IJ SOLN
INTRAMUSCULAR | Status: AC
  Filled 2015-02-26: qty 30

## 2015-02-26 MED ORDER — FENTANYL CITRATE (PF) 100 MCG/2ML IJ SOLN
INTRAMUSCULAR | Status: AC
  Filled 2015-02-26: qty 2

## 2015-02-26 MED ORDER — PHENYLEPHRINE 40 MCG/ML (10ML) SYRINGE FOR IV PUSH (FOR BLOOD PRESSURE SUPPORT)
PREFILLED_SYRINGE | INTRAVENOUS | Status: AC
Start: 2015-02-26 — End: 2015-02-26
  Filled 2015-02-26: qty 10

## 2015-02-26 MED ORDER — PHENYLEPHRINE HCL 10 MG/ML IJ SOLN
INTRAMUSCULAR | Status: DC | PRN
  Administered 2015-02-26 (×2): 40 ug via INTRAVENOUS

## 2015-02-26 MED ORDER — SUCCINYLCHOLINE CHLORIDE 20 MG/ML IJ SOLN
INTRAMUSCULAR | Status: AC
  Filled 2015-02-26: qty 1

## 2015-02-26 MED ORDER — SODIUM CHLORIDE 0.9 % IJ SOLN: 3.0000 mL | INTRAMUSCULAR | Status: DC | PRN

## 2015-02-26 MED ORDER — ONDANSETRON HCL 4 MG/2ML IJ SOLN: 4.0000 mg | Freq: Four times a day (QID) | INTRAMUSCULAR | Status: DC | PRN

## 2015-02-26 MED ORDER — DEXAMETHASONE SODIUM PHOSPHATE 4 MG/ML IJ SOLN
INTRAMUSCULAR | Status: DC | PRN
  Administered 2015-02-26: 10 mg via INTRAVENOUS

## 2015-02-26 MED ORDER — ONDANSETRON HCL 4 MG/2ML IJ SOLN
INTRAMUSCULAR | Status: AC
  Filled 2015-02-26: qty 2

## 2015-02-26 MED ORDER — MORPHINE SULFATE (PF) 2 MG/ML IV SOLN: 1.0000 mg | INTRAVENOUS | Status: DC | PRN

## 2015-02-26 MED ORDER — SCOPOLAMINE 1 MG/3DAYS TD PT72: 1.0000 | MEDICATED_PATCH | Freq: Once | TRANSDERMAL | Status: DC | PRN

## 2015-02-26 MED ORDER — GLYCOPYRROLATE 0.2 MG/ML IJ SOLN
0.2000 mg | Freq: Once | INTRAMUSCULAR | Status: AC | PRN
  Administered 2015-02-26: 0.2 mg via INTRAVENOUS

## 2015-02-26 MED ORDER — MIDAZOLAM HCL 2 MG/2ML IJ SOLN
INTRAMUSCULAR | Status: AC
  Filled 2015-02-26: qty 2

## 2015-02-26 MED ORDER — ONDANSETRON HCL 4 MG/2ML IJ SOLN
INTRAMUSCULAR | Status: DC | PRN
  Administered 2015-02-26: 4 mg via INTRAVENOUS

## 2015-02-26 MED ORDER — OXYCODONE HCL 5 MG PO TABS: 5.0000 mg | ORAL_TABLET | ORAL | Status: AC | PRN

## 2015-02-26 MED ORDER — MIDAZOLAM HCL 2 MG/2ML IJ SOLN
1.0000 mg | INTRAMUSCULAR | Status: DC | PRN
  Administered 2015-02-26: 2 mg via INTRAVENOUS

## 2015-02-26 MED ORDER — OXYCODONE HCL 5 MG/5ML PO SOLN: 5.0000 mg | Freq: Once | ORAL | Status: AC | PRN

## 2015-02-26 MED ORDER — FENTANYL CITRATE (PF) 100 MCG/2ML IJ SOLN
50.0000 ug | INTRAMUSCULAR | Status: DC | PRN
  Administered 2015-02-26: 100 ug via INTRAVENOUS

## 2015-02-26 MED ORDER — PROPOFOL 10 MG/ML IV BOLUS
INTRAVENOUS | Status: AC
  Filled 2015-02-26: qty 20

## 2015-02-26 MED ORDER — BUPIVACAINE-EPINEPHRINE (PF) 0.5% -1:200000 IJ SOLN
INTRAMUSCULAR | Status: DC | PRN
  Administered 2015-02-26: 20 mL via PERINEURAL

## 2015-02-26 MED ORDER — PROPOFOL 500 MG/50ML IV EMUL
INTRAVENOUS | Status: AC
  Filled 2015-02-26: qty 50

## 2015-02-26 MED ORDER — OXYCODONE HCL 5 MG PO TABS
ORAL_TABLET | ORAL | Status: AC
  Filled 2015-02-26: qty 1

## 2015-02-26 MED ORDER — BUPIVACAINE-EPINEPHRINE (PF) 0.5% -1:200000 IJ SOLN
INTRAMUSCULAR | Status: AC
  Filled 2015-02-26: qty 90

## 2015-02-26 MED ORDER — SUCCINYLCHOLINE CHLORIDE 20 MG/ML IJ SOLN
INTRAMUSCULAR | Status: DC | PRN
  Administered 2015-02-26: 100 mg via INTRAVENOUS

## 2015-02-26 MED ORDER — SODIUM CHLORIDE 0.9 % IJ SOLN: 3.0000 mL | Freq: Two times a day (BID) | INTRAMUSCULAR | Status: DC

## 2015-02-26 MED ORDER — LIDOCAINE HCL (CARDIAC) 20 MG/ML IV SOLN
INTRAVENOUS | Status: DC | PRN
  Administered 2015-02-26: 60 mg via INTRAVENOUS

## 2015-02-26 MED ORDER — OXYCODONE HCL 5 MG PO TABS: 5.0000 mg | ORAL_TABLET | ORAL | Status: DC | PRN

## 2015-02-26 MED ORDER — ACETAMINOPHEN 650 MG RE SUPP: 650.0000 mg | RECTAL | Status: DC | PRN

## 2015-02-26 MED ORDER — ACETAMINOPHEN 325 MG PO TABS: 650.0000 mg | ORAL_TABLET | ORAL | Status: DC | PRN

## 2015-02-26 MED ORDER — HYDROMORPHONE HCL 1 MG/ML IJ SOLN
INTRAMUSCULAR | Status: AC
  Filled 2015-02-26: qty 1

## 2015-02-26 MED ORDER — HYDROMORPHONE HCL 1 MG/ML IJ SOLN
0.2500 mg | INTRAMUSCULAR | Status: DC | PRN
  Administered 2015-02-26 (×4): 0.5 mg via INTRAVENOUS

## 2015-02-26 MED ORDER — DEXAMETHASONE SODIUM PHOSPHATE 10 MG/ML IJ SOLN
INTRAMUSCULAR | Status: AC
  Filled 2015-02-26: qty 2

## 2015-02-26 MED ORDER — OXYCODONE-ACETAMINOPHEN 5-325 MG PO TABS: 1.0000 | ORAL_TABLET | ORAL | Status: DC | PRN

## 2015-02-26 MED ORDER — OXYCODONE HCL 5 MG PO TABS
5.0000 mg | ORAL_TABLET | Freq: Once | ORAL | Status: AC | PRN
  Administered 2015-02-26: 5 mg via ORAL

## 2015-02-26 MED ORDER — CEFAZOLIN SODIUM-DEXTROSE 2-3 GM-% IV SOLR
2.0000 g | INTRAVENOUS | Status: AC
  Administered 2015-02-26: 2 g via INTRAVENOUS

## 2015-02-26 MED ORDER — LACTATED RINGERS IV SOLN
INTRAVENOUS | Status: DC
  Administered 2015-02-26 (×2): via INTRAVENOUS
  Administered 2015-02-26: 10 mL/h via INTRAVENOUS

## 2015-02-26 MED ORDER — PROPOFOL 10 MG/ML IV BOLUS
INTRAVENOUS | Status: DC | PRN
  Administered 2015-02-26: 200 mg via INTRAVENOUS

## 2015-02-26 MED ORDER — LIDOCAINE HCL (CARDIAC) 20 MG/ML IV SOLN
INTRAVENOUS | Status: AC
  Filled 2015-02-26: qty 5

## 2015-02-26 MED ORDER — CEFAZOLIN SODIUM-DEXTROSE 2-3 GM-% IV SOLR
INTRAVENOUS | Status: AC
Start: 2015-02-26 — End: 2015-02-26
  Filled 2015-02-26: qty 50

## 2015-02-26 MED ORDER — SODIUM CHLORIDE 0.9 % IV SOLN: 250.0000 mL | INTRAVENOUS | Status: DC | PRN

## 2015-02-26 SURGICAL SUPPLY — 35 items
APPLIER CLIP LOGIC TI 5 (MISCELLANEOUS) IMPLANT
APR CLP MED LRG 33X5 (MISCELLANEOUS)
BLADE CLIPPER SURG (BLADE) IMPLANT
CHLORAPREP W/TINT 26ML (MISCELLANEOUS) ×2 IMPLANT
DEVICE SECURE STRAP 25 ABSORB (INSTRUMENTS) ×2 IMPLANT
DISSECT BALLN SPACEMKR + OVL (BALLOONS) ×2
DISSECTOR BALLN SPACEMKR + OVL (BALLOONS) ×1 IMPLANT
DISSECTOR BLUNT TIP ENDO 5MM (MISCELLANEOUS) IMPLANT
ELECT REM PT RETURN 9FT ADLT (ELECTROSURGICAL) ×2
ELECTRODE REM PT RTRN 9FT ADLT (ELECTROSURGICAL) ×1 IMPLANT
GLOVE BIO SURGEON STRL SZ7 (GLOVE) ×1 IMPLANT
GLOVE BIOGEL PI IND STRL 7.0 (GLOVE) IMPLANT
GLOVE BIOGEL PI IND STRL 7.5 (GLOVE) IMPLANT
GLOVE BIOGEL PI INDICATOR 7.0 (GLOVE) ×1
GLOVE BIOGEL PI INDICATOR 7.5 (GLOVE) ×1
GLOVE SURG SIGNA 7.5 PF LTX (GLOVE) ×2 IMPLANT
GOWN STRL REUS W/ TWL LRG LVL3 (GOWN DISPOSABLE) ×1 IMPLANT
GOWN STRL REUS W/ TWL XL LVL3 (GOWN DISPOSABLE) ×1 IMPLANT
GOWN STRL REUS W/TWL LRG LVL3 (GOWN DISPOSABLE) ×2
GOWN STRL REUS W/TWL XL LVL3 (GOWN DISPOSABLE) ×2
IMMOBILIZER KNEE 22 UNIV (SOFTGOODS) ×1 IMPLANT
LIQUID BAND (GAUZE/BANDAGES/DRESSINGS) ×3 IMPLANT
MESH 3DMAX 4X6 RT LRG (Mesh General) ×1 IMPLANT
NDL INSUFFLATION 14GA 120MM (NEEDLE) IMPLANT
NEEDLE INSUFFLATION 14GA 120MM (NEEDLE) IMPLANT
PACK BASIN DAY SURGERY FS (CUSTOM PROCEDURE TRAY) ×2 IMPLANT
SCISSORS LAP 5X35 DISP (ENDOMECHANICALS) IMPLANT
SET IRRIG TUBING LAPAROSCOPIC (IRRIGATION / IRRIGATOR) IMPLANT
SET TROCAR LAP APPLE-HUNT 5MM (ENDOMECHANICALS) ×2 IMPLANT
SLEEVE SCD COMPRESS KNEE MED (MISCELLANEOUS) ×2 IMPLANT
SUT MNCRL AB 4-0 PS2 18 (SUTURE) ×2 IMPLANT
TOWEL OR 17X24 6PK STRL BLUE (TOWEL DISPOSABLE) ×2 IMPLANT
TRAY FOLEY CATH SILVER 16FR (SET/KITS/TRAYS/PACK) ×2 IMPLANT
TRAY LAPAROSCOPIC (CUSTOM PROCEDURE TRAY) ×2 IMPLANT
TUBING INSUFFLATION (TUBING) ×2 IMPLANT

## 2015-02-26 NOTE — Interval H&P Note (Signed)
History and Physical Interval Note:no change in H and P  02/26/2015 9:23 AM  Austin Frazier  has presented today for surgery, with the diagnosis of Right inguinal hernia   The various methods of treatment have been discussed with the patient and family. After consideration of risks, benefits and other options for treatment, the patient has consented to  Procedure(s): LAPAROSCOPIC RIGHT INGUINAL HERNIA WITH MESH (Right) INSERTION OF MESH (Right) as a surgical intervention .  The patient's history has been reviewed, patient examined, no change in status, stable for surgery.  I have reviewed the patient's chart and labs.  Questions were answered to the patient's satisfaction.     [name] A

## 2015-02-26 NOTE — Progress Notes (Signed)
Upon transfer of pt from bed to recliner it was discovered that pt was unable to stand on right leg.  Pt states is unable to lift leg. Dr Payton Doughty notified and came to evaluate pt.  Dr Ninfa Linden also notified and came and evaluated.  Vital signs stable/ surgical incisions- WNL.  Will continue to evaluate pt.

## 2015-02-26 NOTE — Op Note (Signed)
LAPAROSCOPIC RIGHT INGUINAL HERNIA WITH MESH, INSERTION OF MESH  Procedure Note  Austin Frazier 02/26/2015   Pre-op Diagnosis: Right inguinal hernia      Post-op Diagnosis: same  Procedure(s): LAPAROSCOPIC RIGHT INGUINAL HERNIA WITH MESH INSERTION OF MESH  Surgeon(s): Coralie Keens, MD  Anesthesia: General  Staff:  Circulator: Becky Sax, RN; Glenna Fellows, RN Scrub Person: Maurene Capes, RN  Estimated Blood Loss: Minimal           Austin Frazier   Date: 02/26/2015  Time: 10:11 AM

## 2015-02-26 NOTE — Anesthesia Procedure Notes (Signed)
Procedure Name: Intubation Date/Time: 02/26/2015 9:37 AM Performed by: Bernardino Dowell D Pre-anesthesia Checklist: Patient identified, Emergency Drugs available, Suction available and Patient being monitored Patient Re-evaluated:Patient Re-evaluated prior to inductionOxygen Delivery Method: Circle System Utilized Preoxygenation: Pre-oxygenation with 100% oxygen Intubation Type: IV induction Ventilation: Mask ventilation without difficulty Laryngoscope Size: Mac and 3 Grade View: Grade I Tube type: Oral Tube size: 7.0 mm Number of attempts: 1 Airway Equipment and Method: Stylet and Oral airway Placement Confirmation: ETT inserted through vocal cords under direct vision,  positive ETCO2 and breath sounds checked- equal and bilateral Secured at: 21 cm Tube secured with: Tape Dental Injury: Teeth and Oropharynx as per pre-operative assessment

## 2015-02-26 NOTE — Anesthesia Preprocedure Evaluation (Addendum)
Anesthesia Evaluation  Patient identified by MRN, date of birth, ID band Patient awake    Reviewed: Allergy & Precautions, NPO status , Patient's Chart, lab work & pertinent test results  Airway Mallampati: II   Neck ROM: full    Dental   Pulmonary COPD,  COPD inhaler, Current Smoker,    breath sounds clear to auscultation       Cardiovascular negative cardio ROS   Rhythm:regular Rate:Normal     Neuro/Psych PSYCHIATRIC DISORDERS Anxiety Depression    GI/Hepatic GERD  Medicated,  Endo/Other    Renal/GU      Musculoskeletal   Abdominal   Peds  Hematology   Anesthesia Other Findings   Reproductive/Obstetrics                            Anesthesia Physical Anesthesia Plan  ASA: II  Anesthesia Plan: General   Post-op Pain Management:    Induction: Intravenous  Airway Management Planned: Oral ETT  Additional Equipment:   Intra-op Plan:   Post-operative Plan: Extubation in OR  Informed Consent: I have reviewed the patients History and Physical, chart, labs and discussed the procedure including the risks, benefits and alternatives for the proposed anesthesia with the patient or authorized representative who has indicated his/her understanding and acceptance.     Plan Discussed with: CRNA, Anesthesiologist and Surgeon  Anesthesia Plan Comments:         Anesthesia Quick Evaluation

## 2015-02-26 NOTE — Anesthesia Postprocedure Evaluation (Signed)
Anesthesia Post Note  Patient: Austin Frazier  Procedure(s) Performed: Procedure(s) (LRB): LAPAROSCOPIC RIGHT INGUINAL HERNIA WITH MESH (Right) INSERTION OF MESH (Right)  Patient location during evaluation: PACU Anesthesia Type: General Level of consciousness: awake and alert and patient cooperative Pain management: pain level controlled Vital Signs Assessment: post-procedure vital signs reviewed and stable Respiratory status: spontaneous breathing and respiratory function stable Cardiovascular status: stable Anesthetic complications: no    Last Vitals:  Filed Vitals:   02/26/15 1045 02/26/15 1100  BP: 152/101 141/104  Pulse: 93 97  Temp:    Resp: 18 21    Last Pain:  Filed Vitals:   02/26/15 1103  PainSc: 7                  Roselia Snipe S

## 2015-02-26 NOTE — Discharge Instructions (Signed)
[facility] ______[facility] LAPAROSCOPIC SURGERY: POST OP INSTRUCTIONS Always review your discharge instruction sheet given to you by the facility where your surgery was performed. IF YOU HAVE DISABILITY OR FAMILY LEAVE FORMS, YOU MUST BRING THEM TO THE OFFICE FOR PROCESSING.   DO NOT GIVE THEM TO YOUR DOCTOR.  1. A prescription for pain medication may be given to you upon discharge.  Take your pain medication as prescribed, if needed.  If narcotic pain medicine is not needed, then you may take acetaminophen (Tylenol) or ibuprofen (Advil) as needed. 2. Take your usually prescribed medications unless otherwise directed. 3. If you need a refill on your pain medication, please contact your pharmacy.  They will contact our office to request authorization. Prescriptions will not be filled after 5pm or on week-ends. 4. You should follow a light diet the first few days after arrival home, such as soup and crackers, etc.  Be sure to include lots of fluids daily. 5. Most patients will experience some swelling and bruising in the area of the incisions.  Ice packs will help.  Swelling and bruising can take several days to resolve.  6. It is common to experience some constipation if taking pain medication after surgery.  Increasing fluid intake and taking a stool softener (such as Colace) will usually help or prevent this problem from occurring.  A mild laxative (Milk of Magnesia or Miralax) should be taken according to package instructions if there are no bowel movements after 48 hours. 7. Unless discharge instructions indicate otherwise, you may remove your bandages 24-48 hours after surgery, and you may shower at that time.  You may have steri-strips (small skin tapes) in place directly over the incision.  These strips should be left on the skin for 7-10 days.  If your surgeon used skin glue on the incision, you may shower in 24 hours.  The glue will flake off over the next 2-3 weeks.  Any sutures or  staples will be removed at the office during your follow-up visit. 8. ACTIVITIES:  You may resume regular (light) daily activities beginning the next day--such as daily self-care, walking, climbing stairs--gradually increasing activities as tolerated.  You may have sexual intercourse when it is comfortable.  Refrain from any heavy lifting or straining until approved by your doctor. a. You may drive when you are no longer taking prescription pain medication, you can comfortably wear a seatbelt, and you can safely maneuver your car and apply brakes. b. RETURN TO WORK:  __________________________________________________________ 9. You should see your doctor in the office for a follow-up appointment approximately 2-3 weeks after your surgery.  Make sure that you call for this appointment within a day or two after you arrive home to insure a convenient appointment time. 10. OTHER INSTRUCTIONS: ___NO LIFTING MORE THAN 15 TO 20 POUNDS FOR 3 WEEKS 11. ICE PACK AND IBUPROFEN ALSO FOR PAIN 12. _______________________________________________________________________________________________________________________ __________________________________________________________________________________________________________________________ WHEN TO CALL YOUR DOCTOR: 1. Fever over 101.0 2. Inability to urinate 3. Continued bleeding from incision. 4. Increased pain, redness, or drainage from the incision. 5. Increasing abdominal pain  The clinic staff is available to answer your questions during regular business hours.  Please dont hesitate to call and ask to speak to one of the nurses for clinical concerns.  If you have a medical emergency, go to the nearest emergency room or call 911.  A surgeon from [facility] is always on call at the hospital. [address] [place], [place], [place]  [id number] ? [address], [place],    [address] [phone] ? [phone] ? FAX [phone] Web  site: [email or web address]   Post Anesthesia Home Care Instructions  Activity: Get plenty of rest for the remainder of the day. A responsible adult should stay with you for 24 hours following the procedure.  For the next 24 hours, DO NOT: -Drive a car -Paediatric nurse -Drink alcoholic beverages -Take any medication unless instructed by your physician -Make any legal decisions or sign important papers.  Meals: Start with liquid foods such as gelatin or soup. Progress to regular foods as tolerated. Avoid greasy, spicy, heavy foods. If nausea and/or vomiting occur, drink only clear liquids until the nausea and/or vomiting subsides. Call your physician if vomiting continues.  Special Instructions/Symptoms: Your throat may feel dry or sore from the anesthesia or the breathing tube placed in your throat during surgery. If this causes discomfort, gargle with warm salt water. The discomfort should disappear within 24 hours.  If you had a scopolamine patch placed behind your ear for the management of post- operative nausea and/or vomiting:  1. The medication in the patch is effective for 72 hours, after which it should be removed.  Wrap patch in a tissue and discard in the trash. Wash hands thoroughly with soap and water. 2. You may remove the patch earlier than 72 hours if you experience unpleasant side effects which may include dry mouth, dizziness or visual disturbances. 3. Avoid touching the patch. Wash your hands with soap and water after contact with the patch.

## 2015-02-26 NOTE — Transfer of Care (Signed)
Immediate Anesthesia Transfer of Care Note  Patient: Austin Frazier  Procedure(s) Performed: Procedure(s): LAPAROSCOPIC RIGHT INGUINAL HERNIA WITH MESH (Right) INSERTION OF MESH (Right)  Patient Location: PACU  Anesthesia Type:General  Level of Consciousness: awake, alert , oriented and patient cooperative  Airway & Oxygen Therapy: Patient Spontanous Breathing and Patient connected to face mask oxygen  Post-op Assessment: Report given to RN and Post -op Vital signs reviewed and stable  Post vital signs: Reviewed and stable  Last Vitals:  Filed Vitals:   02/26/15 0837  BP: 133/89  Pulse: 99  Temp: 36.5 C  Resp: 16    Complications: No apparent anesthesia complications

## 2015-02-27 ENCOUNTER — Encounter (HOSPITAL_BASED_OUTPATIENT_CLINIC_OR_DEPARTMENT_OTHER): Payer: Self-pay | Admitting: Surgery

## 2015-02-27 NOTE — Op Note (Signed)
NAME:  Austin, Frazier                       ACCOUNT NO.:  MEDICAL RECORD NO.:  [id number]  LOCATION:                                 FACILITY:  PHYSICIAN:  Coralie Keens, M.D.      DATE OF BIRTH:  DATE OF PROCEDURE:  02/26/2015 DATE OF DISCHARGE:                              OPERATIVE REPORT   PREOPERATIVE DIAGNOSIS:  Right inguinal hernia.  POSTOPERATIVE DIAGNOSIS:  Right inguinal hernia.  PROCEDURE:  Laparoscopic right inguinal hernia repair with mesh.  SURGEON:  Aldrick Derrig A. Ninfa Linden, M.D.  ANESTHESIA:  General and 0.5% Marcaine.  ESTIMATED BLOOD LOSS:  Minimal.  FINDINGS:  The patient was found to have a chronically thick-walled indirect right inguinal hernia without evidence of left inguinal hernia or direct hernia.  It was repaired with a piece of Bard Prolene 3DMax large mesh.  PROCEDURE IN DETAIL:  The patient was brought to the operating room, identified as [name].  He was placed supine on the operating room table and general anesthesia was induced.  A Foley catheter was then inserted.  His abdomen was then prepped and draped in usual sterile fashion.  I made a small vertical incision below the umbilicus.  I carried this down to the fascia which was then opened just to the right of the midline.  The rectus muscles were then identified and elevated. I passed a dissecting balloon underneath the rectus sheath and manipulated it towards the pubis.  The dissecting balloon was then insufflated under direct vision dissecting out the preperitoneal space. The dissecting balloon was then removed and insufflation was begun with carbon dioxide.  I placed two 5-mm trocars in the patient's midline under direct vision.  I then evaluated the inguinal areas.  There was no evidence of left inguinal hernia.  I dissected out the right groin and found a very large, thickened, chronic indirect right inguinal hernia sac.  There was no evidence of direct hernia.  I was able to  completely reduce the hernia sac from the cord structures.  I then brought a large piece of Bard 3DMax mesh onto the field.  I placed it through the umbilical trocar and opened as an onlay on the right inguinal floor.  I then tacked it to Baltic ligament up the medial abdominal wall and slightly laterally.  I was able to keep the hernia sac completely reduced away from the mesh and cord structures.  Excellent coverage of the cord structures and external ring appeared to be achieved.  At this point, hemostasis also appeared to be achieved.  I then deflated the preperitoneal space and the mesh appeared to lay appropriately.  The ports were then removed under direct vision.  I then closed the fascial defect at the umbilicus with a 0 Vicryl suture.  I anesthetized all the wounds with Marcaine and performed a right ilioinguinal nerve block with Marcaine.  I then closed all incisions with 4-0 Monocryl sutures and skin glue.  The patient tolerated the procedure well.  All the counts were correct at the end of the procedure.  The patient was then extubated in the operating room and taken in a  stable condition to the recovery room.     Coralie Keens, M.D.     DB/MEDQ  D:  02/26/2015  T:  02/26/2015  Job:  [id number]

## 2015-06-15 IMAGING — CR DG CHEST 2V
2 series · 2 of 2 positions shown · non-contrast
Comparison: January 22, 2011.

CLINICAL DATA: Chronic obstructive pulmonary disease.

EXAM:
CHEST  2 VIEW

[w chest pa]
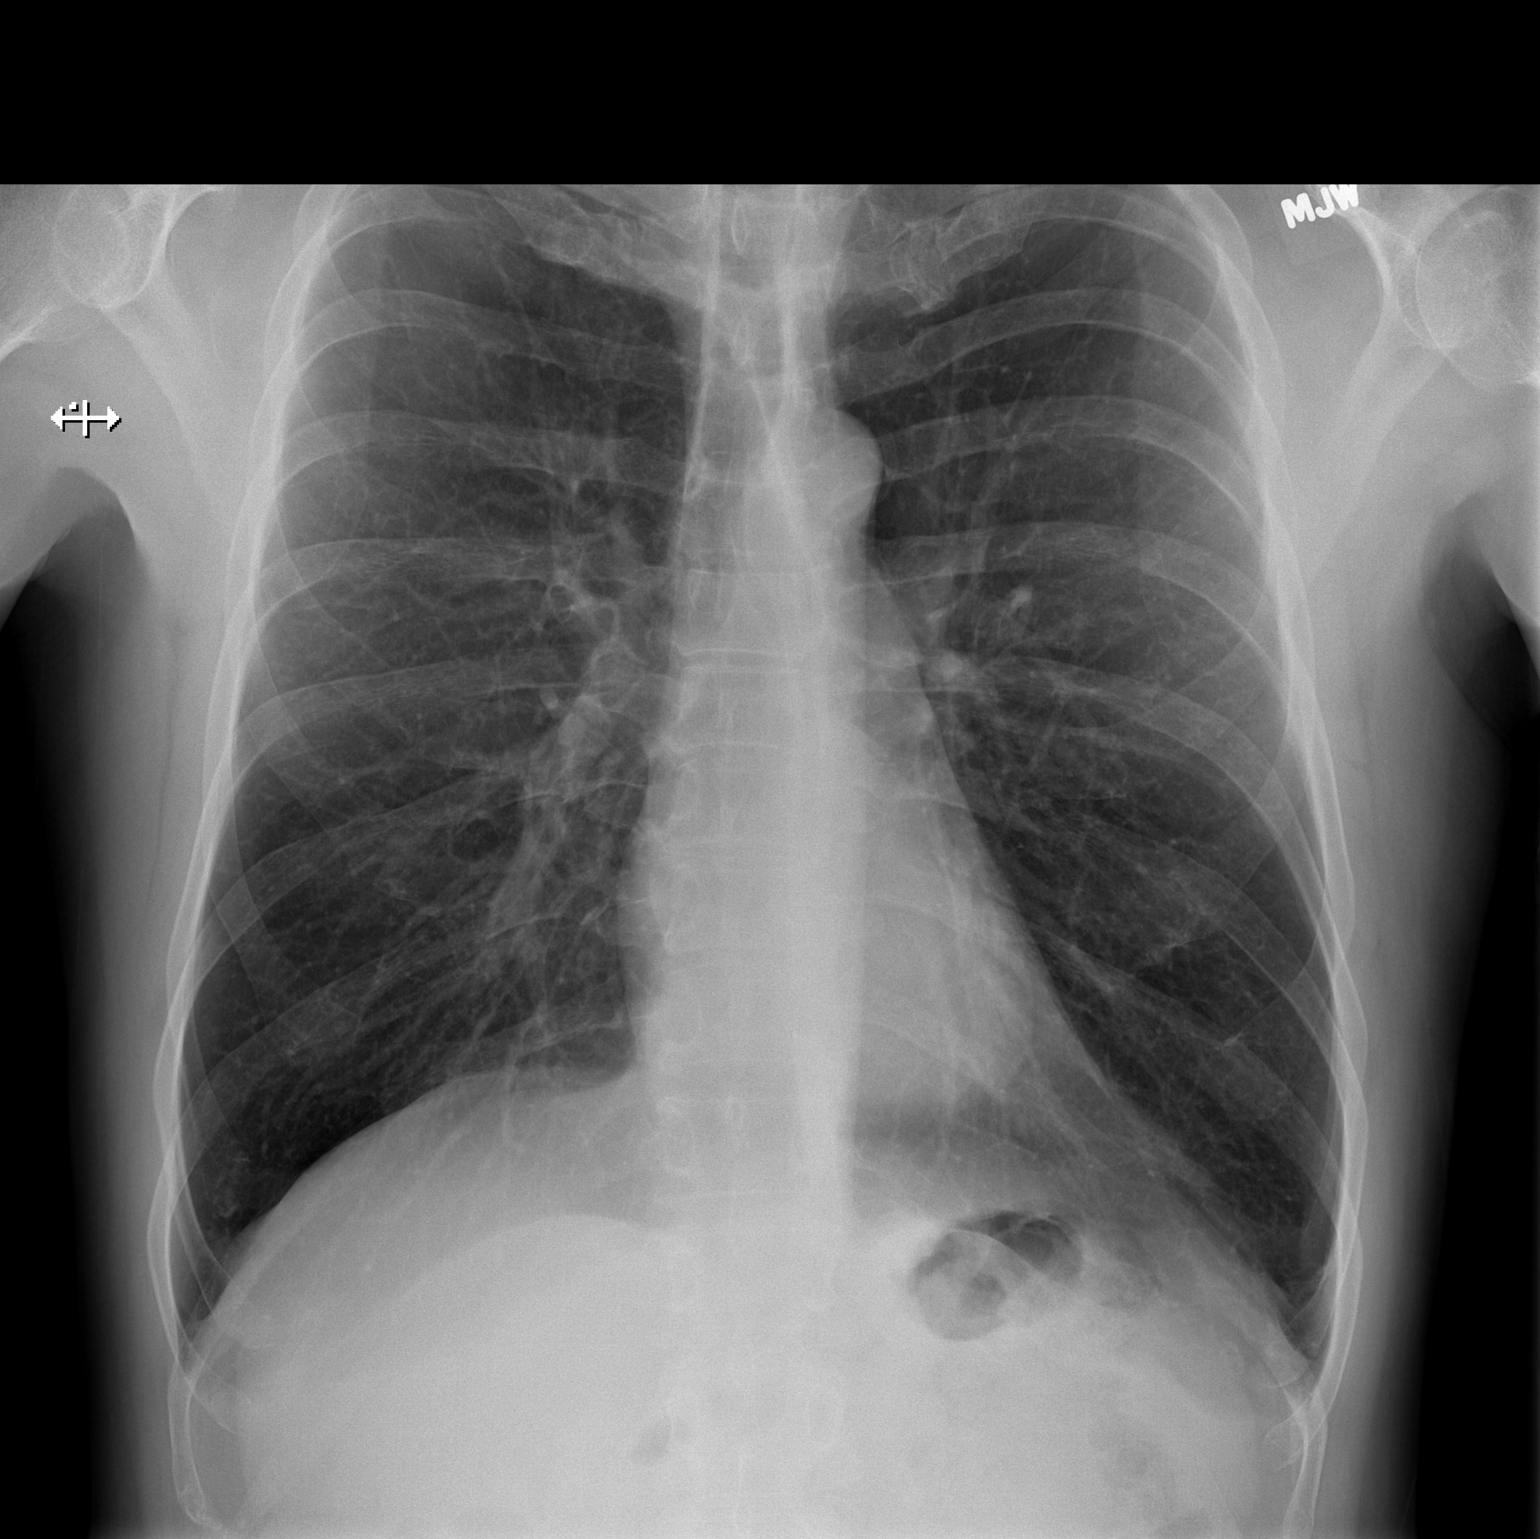

[w chest lat]
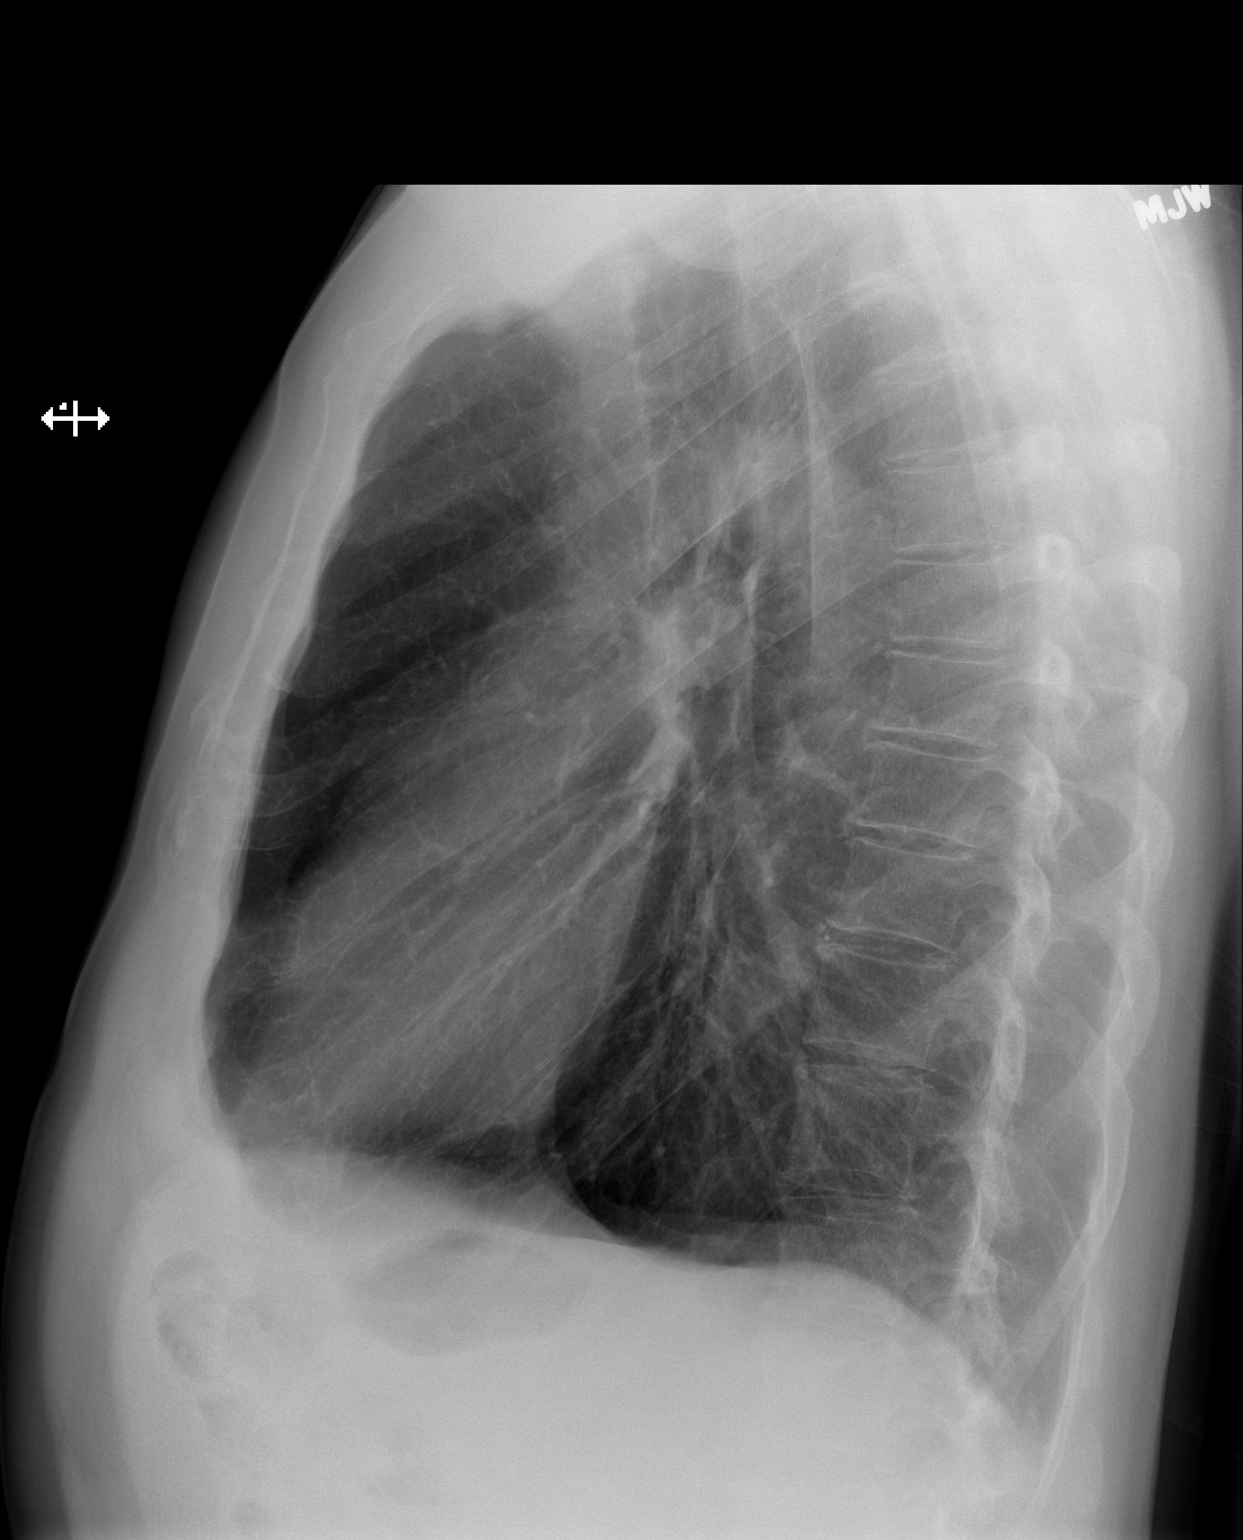

[2 of 2 positions shown; findings below may reference images not displayed]

FINDINGS: Cardiomediastinal silhouette appears normal. Hyperexpansion of the
lungs is noted consistent with chronic obstructive pulmonary
disease. No acute pulmonary disease is noted. No pneumothorax or
pleural effusion is noted. Bony thorax is intact.
IMPRESSION: Hyperexpansion of the lungs consistent with chronic obstructive
pulmonary disease. No acute cardiopulmonary abnormality seen.
